# Patient Record
Sex: Female | Born: 1975 | Race: Asian | Hispanic: No | Marital: Married | Smoking: Never smoker
Health system: Southern US, Community
[De-identification: ages and names within clinical notes are randomized; demographics above are authoritative.]

## PROBLEM LIST (undated history)

## (undated) DIAGNOSIS — E785 Hyperlipidemia, unspecified: Secondary | ICD-10-CM

## (undated) DIAGNOSIS — D563 Thalassemia minor: Secondary | ICD-10-CM

## (undated) DIAGNOSIS — N200 Calculus of kidney: Secondary | ICD-10-CM

## (undated) DIAGNOSIS — R000 Tachycardia, unspecified: Secondary | ICD-10-CM

## (undated) DIAGNOSIS — M722 Plantar fascial fibromatosis: Secondary | ICD-10-CM

## (undated) DIAGNOSIS — K219 Gastro-esophageal reflux disease without esophagitis: Secondary | ICD-10-CM

## (undated) DIAGNOSIS — M2141 Flat foot [pes planus] (acquired), right foot: Secondary | ICD-10-CM

## (undated) DIAGNOSIS — N6019 Diffuse cystic mastopathy of unspecified breast: Secondary | ICD-10-CM

## (undated) DIAGNOSIS — J45909 Unspecified asthma, uncomplicated: Secondary | ICD-10-CM

## (undated) DIAGNOSIS — M2142 Flat foot [pes planus] (acquired), left foot: Secondary | ICD-10-CM

## (undated) DIAGNOSIS — I10 Essential (primary) hypertension: Secondary | ICD-10-CM

## (undated) DIAGNOSIS — T7840XA Allergy, unspecified, initial encounter: Secondary | ICD-10-CM

## (undated) HISTORY — DX: Flat foot (pes planus) (acquired), right foot: M21.41

## (undated) HISTORY — PX: TUBAL LIGATION: SHX77

## (undated) HISTORY — DX: Allergy, unspecified, initial encounter: T78.40XA

## (undated) HISTORY — DX: Gastro-esophageal reflux disease without esophagitis: K21.9

## (undated) HISTORY — DX: Calculus of kidney: N20.0

## (undated) HISTORY — DX: Unspecified asthma, uncomplicated: J45.909

## (undated) HISTORY — DX: Flat foot (pes planus) (acquired), left foot: M21.42

## (undated) HISTORY — DX: Thalassemia minor: D56.3

## (undated) HISTORY — DX: Plantar fascial fibromatosis: M72.2

## (undated) HISTORY — DX: Diffuse cystic mastopathy of unspecified breast: N60.19

## (undated) HISTORY — DX: Hyperlipidemia, unspecified: E78.5

---

## 2001-11-17 ENCOUNTER — Encounter: Payer: Self-pay | Admitting: Emergency Medicine

## 2001-11-17 ENCOUNTER — Emergency Department (HOSPITAL_COMMUNITY): Admission: EM | Admit: 2001-11-17 | Discharge: 2001-11-17 | Payer: Self-pay | Admitting: Emergency Medicine

## 2001-11-17 ENCOUNTER — Encounter: Payer: Self-pay | Admitting: *Deleted

## 2005-05-29 ENCOUNTER — Emergency Department: Payer: Self-pay | Admitting: Emergency Medicine

## 2010-09-30 ENCOUNTER — Other Ambulatory Visit: Payer: Self-pay | Admitting: Family Medicine

## 2010-09-30 ENCOUNTER — Ambulatory Visit (INDEPENDENT_AMBULATORY_CARE_PROVIDER_SITE_OTHER): Payer: 59 | Admitting: Family Medicine

## 2010-09-30 ENCOUNTER — Encounter: Payer: Self-pay | Admitting: Family Medicine

## 2010-09-30 DIAGNOSIS — N63 Unspecified lump in unspecified breast: Secondary | ICD-10-CM | POA: Insufficient documentation

## 2010-09-30 DIAGNOSIS — Z0000 Encounter for general adult medical examination without abnormal findings: Secondary | ICD-10-CM

## 2010-09-30 DIAGNOSIS — E785 Hyperlipidemia, unspecified: Secondary | ICD-10-CM

## 2010-09-30 LAB — HEPATIC FUNCTION PANEL
ALT: 13 U/L (ref 0–35)
AST: 17 U/L (ref 0–37)
Albumin: 4.1 g/dL (ref 3.5–5.2)
Alkaline Phosphatase: 66 U/L (ref 39–117)
Bilirubin, Direct: 0.1 mg/dL (ref 0.0–0.3)
Total Bilirubin: 0.6 mg/dL (ref 0.3–1.2)
Total Protein: 7 g/dL (ref 6.0–8.3)

## 2010-09-30 LAB — CBC WITH DIFFERENTIAL/PLATELET
Basophils Absolute: 0 10*3/uL (ref 0.0–0.1)
Basophils Relative: 0.3 % (ref 0.0–3.0)
Eosinophils Absolute: 0 10*3/uL (ref 0.0–0.7)
Eosinophils Relative: 0.6 % (ref 0.0–5.0)
HCT: 40.7 % (ref 36.0–46.0)
Hemoglobin: 13.2 g/dL (ref 12.0–15.0)
Lymphocytes Relative: 29.1 % (ref 12.0–46.0)
Lymphs Abs: 2.3 10*3/uL (ref 0.7–4.0)
MCHC: 32.5 g/dL (ref 30.0–36.0)
MCV: 71.8 fl — ABNORMAL LOW (ref 78.0–100.0)
Monocytes Absolute: 0.5 10*3/uL (ref 0.1–1.0)
Monocytes Relative: 6.9 % (ref 3.0–12.0)
Neutro Abs: 4.9 10*3/uL (ref 1.4–7.7)
Neutrophils Relative %: 63.1 % (ref 43.0–77.0)
Platelets: 310 10*3/uL (ref 150.0–400.0)
RBC: 5.66 Mil/uL — ABNORMAL HIGH (ref 3.87–5.11)
RDW: 15.3 % — ABNORMAL HIGH (ref 11.5–14.6)
WBC: 7.8 10*3/uL (ref 4.5–10.5)

## 2010-09-30 LAB — BASIC METABOLIC PANEL
BUN: 13 mg/dL (ref 6–23)
CO2: 30 mEq/L (ref 19–32)
Calcium: 9.3 mg/dL (ref 8.4–10.5)
Chloride: 103 mEq/L (ref 96–112)
Creatinine, Ser: 0.7 mg/dL (ref 0.4–1.2)
GFR: 110.26 mL/min (ref >60.00)
Glucose, Bld: 85 mg/dL (ref 70–99)
Potassium: 4.6 mEq/L (ref 3.5–5.1)
Sodium: 138 mEq/L (ref 135–145)

## 2010-09-30 LAB — LIPID PANEL
Cholesterol: 222 mg/dL — ABNORMAL HIGH (ref 0–200)
HDL: 58.2 mg/dL (ref >39.00)
Total CHOL/HDL Ratio: 4
Triglycerides: 120 mg/dL (ref 0.0–149.0)
VLDL: 24 mg/dL (ref 0.0–40.0)

## 2010-09-30 LAB — TSH: TSH: 1.02 u[IU]/mL (ref 0.35–5.50)

## 2010-09-30 LAB — LDL CHOLESTEROL, DIRECT: Direct LDL: 133 mg/dL

## 2010-10-04 ENCOUNTER — Ambulatory Visit: Payer: Self-pay | Admitting: Family Medicine

## 2010-10-04 NOTE — Assessment & Plan Note (Signed)
Summary: New Pt to est/[employer]/[facility] mailed NPP   Vital Signs:  Patient profile:   35 year old female Height:      61.5 inches Weight:      154.25 pounds BMI:     28.78 Temp:     98.9 degrees F oral Pulse rate:   76 / minute Pulse rhythm:   regular BP sitting:   112 / 80  (left arm) Cuff size:   regular  Vitals Entered By: Selena Batten Dance CMA Duncan Dull) (September 30, 2010 10:24 AM) CC: New patient to establish care   History of Present Illness: CC: new patient, establish, breast mass  1 1/2 wk h/o noted lump R breast.  upper mid breast.  No h/o lumps/bumps in past.  Not really tender.  LMP 09/20/2010.  usually last 3 days.  usually pretty regular.  no fmhx brca.  nonsmoker.  not on birth control.  preventative: tetanus - 2010, thinks Tdap. well woman exam - has been 2 years since last one.  Usually gets at [facility].  normal pap/breast in past.  -  Date:  07/17/2008    TD booster Tdap per pt  Current Medications (verified): 1)  Multivitamins  Tabs (Multiple Vitamin) .Marland Kitchen.. 1 By Mouth Once Daily  Allergies (verified): 1)  ! Sulfa  Past History:  Past Medical History: GERD mild HLD, never on meds h/o reflux induced asthma  Past Surgical History: BTL 2001 wisdom teeth removed  ~2003  Family History: Father: (dec 2010) lung CA, smoker, T2DM Mother: HTN, HLD Uncle: prostate CA Aunt: several  PGGM: rheumatoid arthritis  No other CA, no breast CA.  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  The patient denies anorexia, fever, weight loss, weight gain, vision loss, decreased hearing, hoarseness, chest pain, syncope, dyspnea on exertion, peripheral edema, prolonged cough, hemoptysis, abdominal pain, melena, hematochezia, severe  indigestion/heartburn, hematuria, and depression.    Physical Exam  General:  Well-developed,well-nourished,in no acute distress; alert,appropriate and cooperative throughout examination Head:  Normocephalic and atraumatic without obvious abnormalities. No apparent alopecia or balding. Eyes:  No corneal or conjunctival inflammation noted. EOMI. Perrla.  Ears:  TMs clear bilaterally Nose:  nares clear bilaterally Mouth:  MMM, no pharyngeal erythema/exudates, good dentition Neck:  No deformities, masses, or tenderness noted. Breasts:  no axillary LAD. R breast with small nodule upper inner quadrant, nontender.  + tender outer quadrant around 9 oclock but no mass appreciated. L breast no masses. no deformity, no skin changes, no nipple discharge, no retractions  Lungs:  Normal respiratory effort, chest expands symmetrically. Lungs are clear to auscultation, no crackles or wheezes. Heart:  Normal rate and regular rhythm. S1 and S2 normal without gallop, murmur, click, rub or other extra sounds. Abdomen:  Bowel sounds positive,abdomen soft and non-tender without masses, organomegaly or hernias noted. Msk:  No deformity or scoliosis noted of thoracic or lumbar spine.   Pulses:  2+ rad pulses, brisk cap refill Extremities:  no pedal edema Neurologic:  Cn grossly intact, station and gait intact Skin:  Intact without suspicious lesions or rashes Psych:  full affect, pleasant and cooperative   Impression & Recommendations:  Problem # 1:  BREAST MASS, RIGHT (ICD-611.72) feels benign, ? fibroadenoma vs scar tissue.  doubt cyst.  check dx mammo.  Orders: Radiology Referral (Radiology)  Problem # 2:  HEALTH MAINTENANCE EXAM (ICD-V70.0) baseline labs today.  preventative discussed, states utd (pap/breast at prior OBGYN)  Orders: Radiology Referral (Radiology)  Complete Medication List: 1)  Multivitamins Tabs (Multiple vitamin) .Marland Kitchen.. 1 by mouth once daily  Other Orders: Venipuncture  (84696) TLB-Lipid Panel (80061-LIPID) TLB-BMP (Basic Metabolic Panel-BMET) (80048-METABOL) TLB-CBC Platelet - w/Differential (85025-CBCD) TLB-Hepatic/Liver Function Pnl (80076-HEPATIC) TLB-TSH (Thyroid Stimulating Hormone) (29528-UXL)  Patient Instructions: 1)  Pass by Marion's office for referral for mammogram. 2)  check on Tdap (tetanus and pertussis shot) 3)  Good to meet you today, call clinic with questions. 4)  blood work today. 5)  Return as needed or in 1-2 years for next physical.   Orders Added: 1)  Venipuncture [36415] 2)  TLB-Lipid Panel [80061-LIPID] 3)  TLB-BMP (Basic Metabolic Panel-BMET) [80048-METABOL] 4)  TLB-CBC Platelet - w/Differential [85025-CBCD] 5)  TLB-Hepatic/Liver Function Pnl [80076-HEPATIC] 6)  TLB-TSH (Thyroid Stimulating Hormone) [84443-TSH] 7)  Radiology Referral [Radiology] 8)  New Patient 18-39 years [99385]     Current Allergies (reviewed today): ! SULFA

## 2010-10-07 ENCOUNTER — Telehealth: Payer: Self-pay | Admitting: Family Medicine

## 2010-10-07 NOTE — Telephone Encounter (Signed)
Discussed results with pt.  Normal mammo/breast US.  rec montior for now, update me if enlarging or changing.  O/w will keep eye on at next CPE.

## 2010-10-12 ENCOUNTER — Encounter: Payer: Self-pay | Admitting: Family Medicine

## 2010-11-20 LAB — HM MAMMOGRAPHY: HM Mammogram: NORMAL

## 2012-01-15 IMAGING — MG MAM DGTL DIAGNOSTIC MAMMO W/CAD
1 series · 8 of 8 positions shown · non-contrast
Comparison: none

REASON FOR EXAM: R MASS 12 OCLOCK  baseline
COMMENTS:

[R CC · right · 8 of 10 slices shown]
[im 1/10]
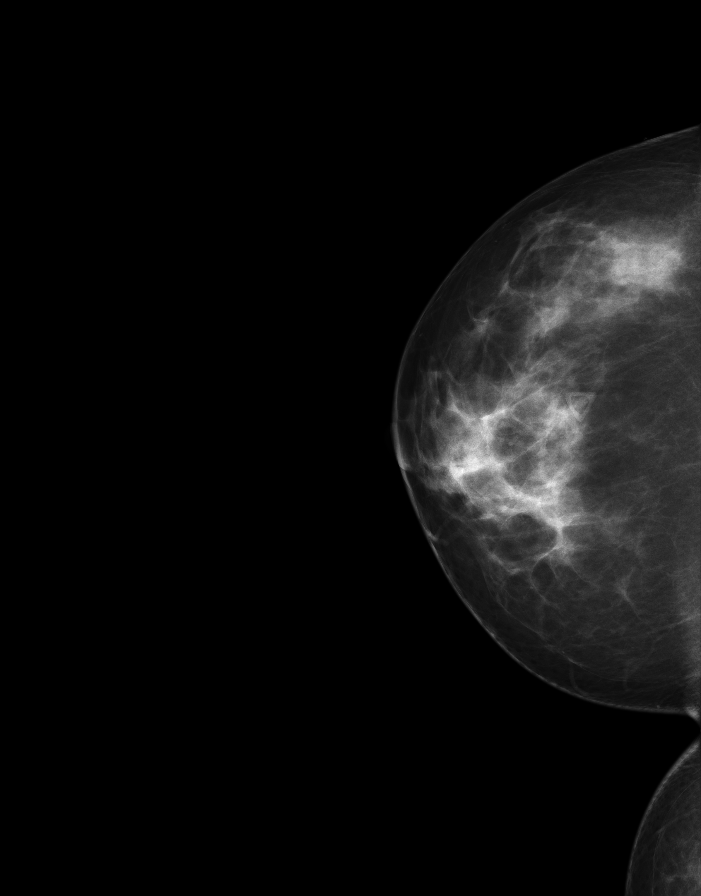
[im 2/10]
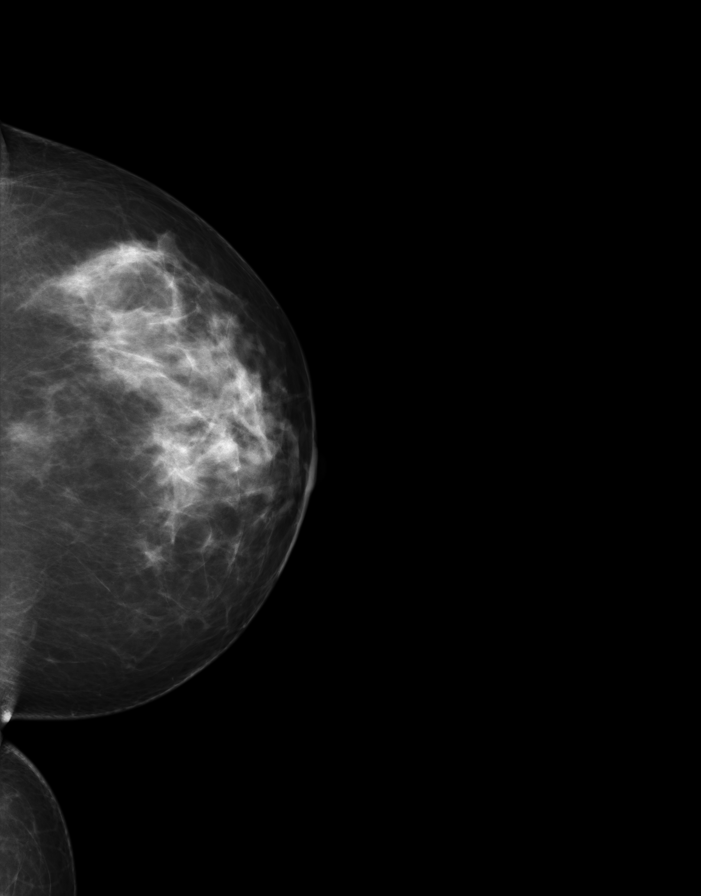
[im 3/10]
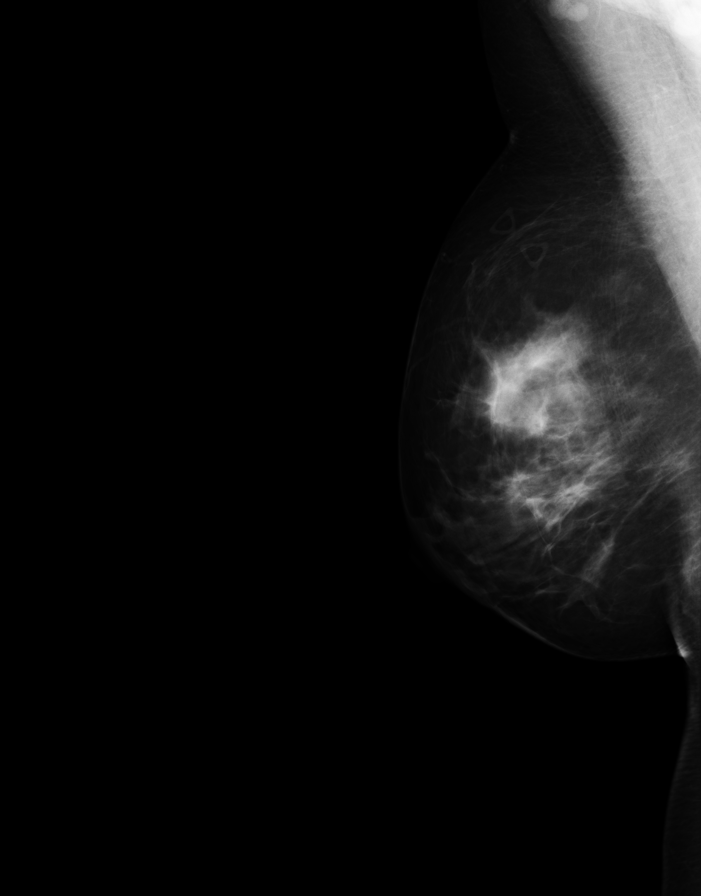
[im 4/10]
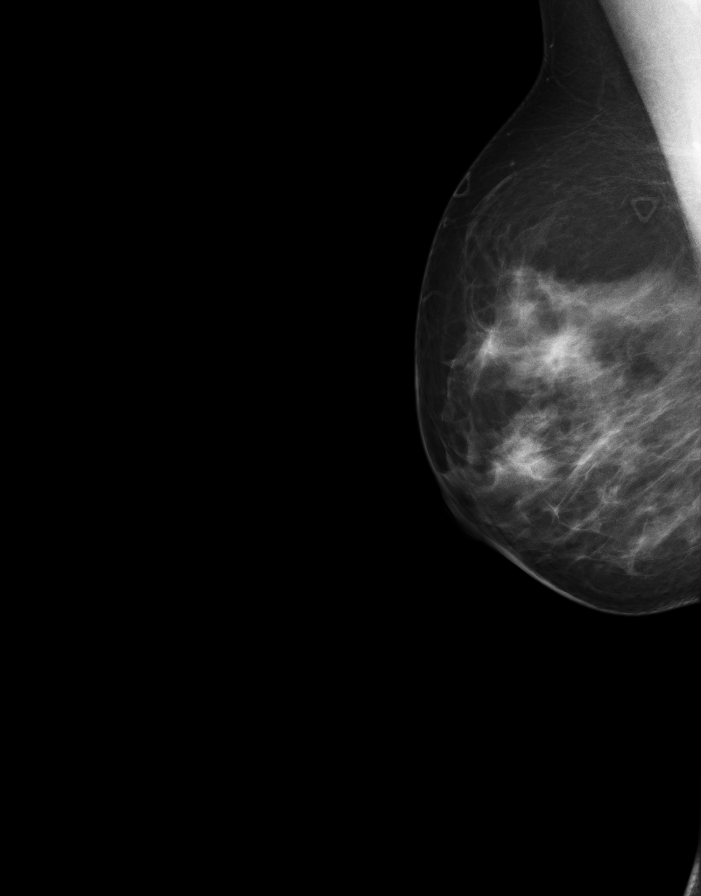
[im 6/10]
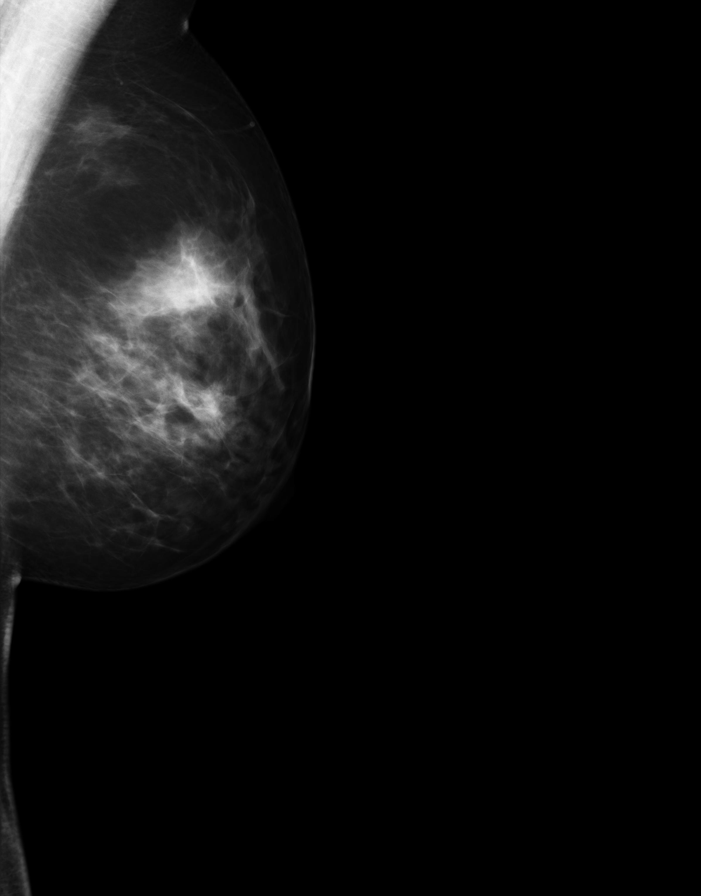
[im 7/10]
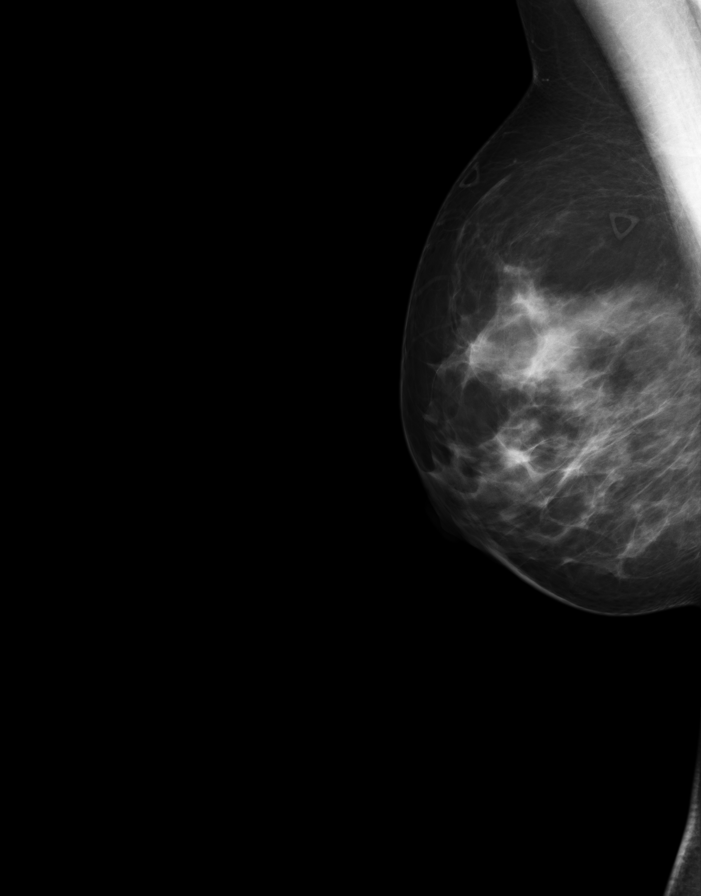
[im 8/10]
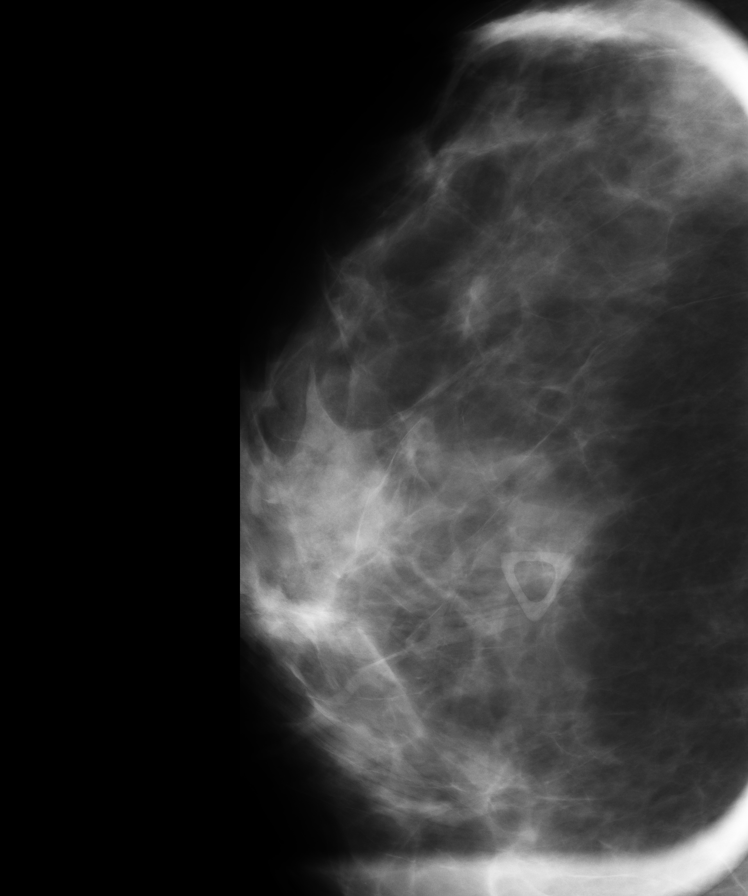
[im 10/10]
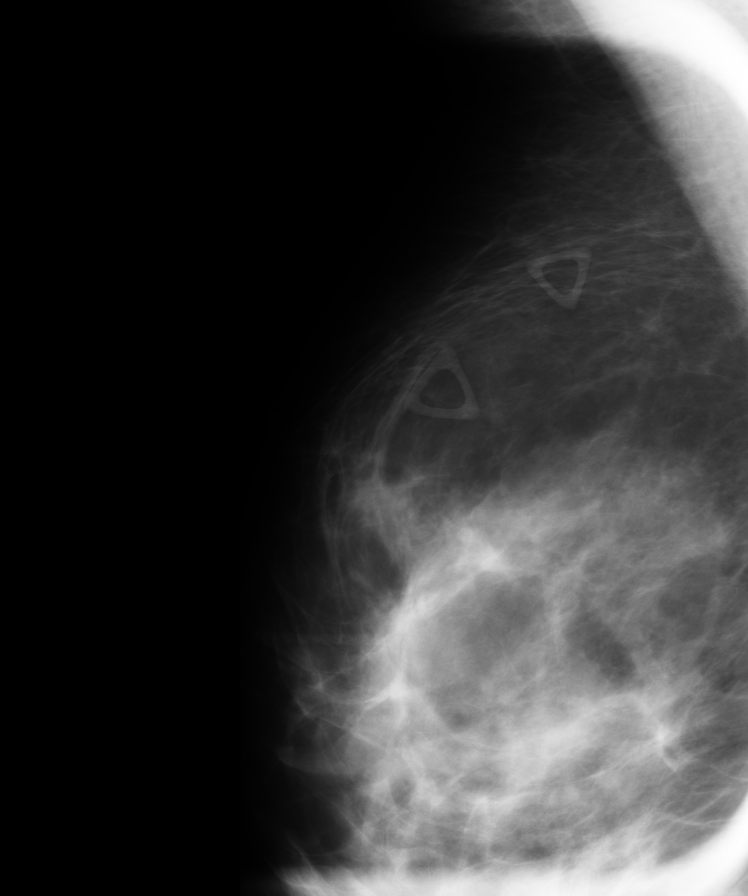

[8 of 8 positions shown; findings below may reference images not displayed]

PROCEDURE:     MAM - MAM DGTL DIAGNOSTIC MAMMO W/CAD  - October 04, 2010  [DATE]

RESULT:     The breasts demonstrate a heterogeneous parenchymal pattern with
moderate diffuse increased density. The region of palpable concern within
the right breast was further evaluated with magnification compression
imaging.  There is no evidence of malignant type calcifications,
architectural distortion, masses or nodules. The breast parenchyma does
demonstrate a heterogeneous parenchymal pattern and the region of concern in
the vicinity of areas of diffuse asymmetric density likely represent a
region of asymmetric normal breast parenchyma. There is no radiographic
evidence to suggest malignancy. If there is persistent clinical concern,
surgical consultation is recommended. Sonographic evaluation of the region
of interest demonstrates no sonographic abnormalities.
IMPRESSION: 1.As stated above, if there is persistent clinical concern, further
evaluation with surgical consultation is recommended.

BI-RADS: Category 2 - Benign Findings

A NEGATIVE MAMMOGRAM REPORT DOES NOT PRECLUDE BIOPSY OR OTHER EVALUATION OF
A CLINICALLY PALPABLE OR OTHERWISE SUSPICIOUS MASS OR LESION. BREAST CANCER
MAY NOT BE DETECTED BY MAMMOGRAPHY IN UP TO 10% OF CASES.

## 2012-11-18 ENCOUNTER — Ambulatory Visit (INDEPENDENT_AMBULATORY_CARE_PROVIDER_SITE_OTHER): Payer: 59 | Admitting: Internal Medicine

## 2012-11-18 ENCOUNTER — Encounter: Payer: Self-pay | Admitting: Internal Medicine

## 2012-11-18 VITALS — BP 124/78 | HR 93 | Temp 98.2°F | Resp 14 | Ht 62.0 in | Wt 167.2 lb

## 2012-11-18 DIAGNOSIS — M722 Plantar fascial fibromatosis: Secondary | ICD-10-CM

## 2012-11-18 DIAGNOSIS — R5381 Other malaise: Secondary | ICD-10-CM

## 2012-11-18 DIAGNOSIS — M2140 Flat foot [pes planus] (acquired), unspecified foot: Secondary | ICD-10-CM

## 2012-11-18 DIAGNOSIS — R5383 Other fatigue: Secondary | ICD-10-CM

## 2012-11-18 DIAGNOSIS — D563 Thalassemia minor: Secondary | ICD-10-CM

## 2012-11-18 DIAGNOSIS — Z124 Encounter for screening for malignant neoplasm of cervix: Secondary | ICD-10-CM

## 2012-11-18 DIAGNOSIS — E785 Hyperlipidemia, unspecified: Secondary | ICD-10-CM

## 2012-11-18 DIAGNOSIS — M2141 Flat foot [pes planus] (acquired), right foot: Secondary | ICD-10-CM

## 2012-11-18 NOTE — Patient Instructions (Addendum)
Return for fasting labs at your leisure  Or just before your physical  We will schedule a mammogram at [facility] at your physical..

## 2012-11-18 NOTE — Progress Notes (Signed)
Patient ID: Amy Lara, female   DOB: March 14, 1976, 37 y.o.   MRN: [id number]   Patient Active Problem List   Diagnosis Date Noted  . Other and unspecified hyperlipidemia 11/19/2012  . Fibrocystic breast disease in female   . Plantar fasciitis, bilateral   . Acquired pes planus of both feet   . Thalassemia carrier     Subjective:  CC:   Chief Complaint  Patient presents with  . Establish Care    HPI:   Amy Lara is a 37 y.o. female who presents as a new patient to establish primary care with the chief complaint of  Reflux induced asthma,  Quiescent for several years  Ago while supine .  Does not occur while exercising. Uses occasional H2 blockers.  Environmental exposure to both parent's cigarette smoke.    Nephrolithiasis,  Occurred years ago while stationed in [place] with husband in [place],  Believed to be secondary to an untreated cystitis leading to  Pyelonephritis leading to stones, per Urology eval,  None in years   High cholesterol found during [facility] labs.  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  No anemia.   Treated for flu at [facility]  Had symptoms On Easter Sunday.  eval on Thursday.  Treated only with cough suppressants .   No constipation issues but does not move them daily   Fibrous cyst on diagnostic mammogram/ultrasound  for self found lump left breast,  2012.     Plantar fasciitis and pes planus , managed with orthotics by Dr Al Corpus.,      Past Medical History  Diagnosis Date  . Asthma   . GERD (gastroesophageal reflux disease)   . Allergy   . Hyperlipidemia   . Kidney stones   . Fibrocystic breast disease in female     no prior biopsy  . Plantar fasciitis, bilateral   . Acquired pes planus of both feet   . Thalassemia carrier     Past Surgical History  Procedure Laterality Date  . Tubal ligation      Family History  Problem Relation Age of Onset  . Hyperlipidemia Mother   . Cancer Father   . Hyperlipidemia Father    . Hyperlipidemia Paternal Aunt   . Hyperlipidemia Paternal Uncle   . Cancer Paternal Uncle   . Heart disease Paternal Grandmother   . Hyperlipidemia Paternal Grandfather     History   Social History  . Marital Status: Married    Spouse Name: N/A    Number of Children: N/A  . Years of Education: N/A   Occupational History  . Not on file.   Social History Main Topics  . Smoking status: Never Smoker   . Smokeless tobacco: Never Used  . Alcohol Use: Yes     Comment: occasioally  . Drug Use: No  . Sexually Active: Not on file   Other Topics Concern  . Not on file   Social History Narrative  . No narrative on file       @ALLHX @    Review of Systems:   The remainder of the review of systems was negative except those addressed in the HPI.       Objective:  BP 124/78  Pulse 93  Temp(Src) 98.2 F (36.8 C) (Oral)  Resp 14  Ht 5\' 2"  (1.575 m)  Wt 167 lb 4 oz (75.864 kg)  BMI 30.58 kg/m2  SpO2 99%  LMP 10/26/2012  General appearance: alert, cooperative and appears  stated age Ears: normal TM's and external ear canals both ears Throat: lips, mucosa, and tongue normal; teeth and gums normal Neck: no adenopathy, no carotid bruit, supple, symmetrical, trachea midline and thyroid not enlarged, symmetric, no tenderness/mass/nodules Back: symmetric, no curvature. ROM normal. No CVA tenderness. Lungs: clear to auscultation bilaterally Heart: regular rate and rhythm, S1, S2 normal, no murmur, click, rub or gallop Abdomen: soft, non-tender; bowel sounds normal; no masses,  no organomegaly Pulses: 2+ and symmetric Skin: Skin color, texture, turgor normal. No rashes or lesions Lymph nodes: Cervical, supraclavicular, and axillary nodes normal.  Assessment and Plan:  Thalassemia carrier No history of anemia,  Husband is not a carrier.,  Daughter has not been tested  Plantar fasciitis, bilateral Managed by Dr Al Corpus  Acquired pes planus of both feet Managed by Dr  Al Corpus WITH ORTHOTICS  Other and unspecified hyperlipidemia Patient will return for fasting lipids prior to her physical.    Updated Medication List No outpatient encounter prescriptions on file as of 11/18/2012.   No facility-administered encounter medications on file as of 11/18/2012.     Orders Placed This Encounter  Procedures  . HM MAMMOGRAPHY  . CBC with Differential  . Comprehensive metabolic panel  . TSH  . Lipid panel  . HM PAP SMEAR    No Follow-up on file.

## 2012-11-19 ENCOUNTER — Encounter: Payer: Self-pay | Admitting: Internal Medicine

## 2012-11-19 DIAGNOSIS — M722 Plantar fascial fibromatosis: Secondary | ICD-10-CM | POA: Insufficient documentation

## 2012-11-19 DIAGNOSIS — N6019 Diffuse cystic mastopathy of unspecified breast: Secondary | ICD-10-CM | POA: Insufficient documentation

## 2012-11-19 DIAGNOSIS — D563 Thalassemia minor: Secondary | ICD-10-CM | POA: Insufficient documentation

## 2012-11-19 DIAGNOSIS — M2141 Flat foot [pes planus] (acquired), right foot: Secondary | ICD-10-CM | POA: Insufficient documentation

## 2012-11-19 DIAGNOSIS — E785 Hyperlipidemia, unspecified: Secondary | ICD-10-CM | POA: Insufficient documentation

## 2012-11-19 NOTE — Assessment & Plan Note (Signed)
Managed by Dr Al Corpus WITH ORTHOTICS

## 2012-11-19 NOTE — Assessment & Plan Note (Signed)
Managed by Dr Al Corpus

## 2012-11-19 NOTE — Assessment & Plan Note (Signed)
No history of anemia,  Husband is not a carrier.,  Daughter has not been tested

## 2012-11-19 NOTE — Assessment & Plan Note (Signed)
Patient will return for fasting lipids prior to her physical.

## 2012-12-30 ENCOUNTER — Other Ambulatory Visit (INDEPENDENT_AMBULATORY_CARE_PROVIDER_SITE_OTHER): Payer: 59

## 2012-12-30 DIAGNOSIS — D563 Thalassemia minor: Secondary | ICD-10-CM

## 2012-12-30 DIAGNOSIS — R5381 Other malaise: Secondary | ICD-10-CM

## 2012-12-30 DIAGNOSIS — R5383 Other fatigue: Secondary | ICD-10-CM

## 2012-12-30 DIAGNOSIS — E785 Hyperlipidemia, unspecified: Secondary | ICD-10-CM

## 2012-12-30 LAB — COMPREHENSIVE METABOLIC PANEL
ALT: 11 U/L (ref 0–35)
AST: 14 U/L (ref 0–37)
Alkaline Phosphatase: 67 U/L (ref 39–117)
Chloride: 103 mEq/L (ref 96–112)
Creatinine, Ser: 0.7 mg/dL (ref 0.4–1.2)
Total Bilirubin: 0.2 mg/dL — ABNORMAL LOW (ref 0.3–1.2)

## 2012-12-30 LAB — CBC WITH DIFFERENTIAL/PLATELET
Basophils Absolute: 0 10*3/uL (ref 0.0–0.1)
Eosinophils Absolute: 0.1 10*3/uL (ref 0.0–0.7)
Hemoglobin: 12 g/dL (ref 12.0–15.0)
Lymphocytes Relative: 29.4 % (ref 12.0–46.0)
Lymphs Abs: 2.4 10*3/uL (ref 0.7–4.0)
MCHC: 31.9 g/dL (ref 30.0–36.0)
MCV: 71.4 fl — ABNORMAL LOW (ref 78.0–100.0)
Monocytes Absolute: 0.6 10*3/uL (ref 0.1–1.0)
Neutro Abs: 5 10*3/uL (ref 1.4–7.7)
RDW: 15.3 % — ABNORMAL HIGH (ref 11.5–14.6)

## 2012-12-30 LAB — LIPID PANEL
Cholesterol: 195 mg/dL (ref 0–200)
HDL: 43.8 mg/dL (ref >39.00)
Total CHOL/HDL Ratio: 4
Triglycerides: 271 mg/dL — ABNORMAL HIGH (ref 0.0–149.0)
VLDL: 54.2 mg/dL — ABNORMAL HIGH (ref 0.0–40.0)

## 2013-01-07 ENCOUNTER — Ambulatory Visit (INDEPENDENT_AMBULATORY_CARE_PROVIDER_SITE_OTHER): Payer: 59 | Admitting: Internal Medicine

## 2013-01-07 ENCOUNTER — Other Ambulatory Visit (HOSPITAL_COMMUNITY)
Admission: RE | Admit: 2013-01-07 | Discharge: 2013-01-07 | Disposition: A | Discharge status: Home / Self Care | Payer: 59 | Source: Ambulatory Visit | Attending: Internal Medicine | Admitting: Internal Medicine

## 2013-01-07 ENCOUNTER — Encounter: Payer: Self-pay | Admitting: Internal Medicine

## 2013-01-07 VITALS — BP 118/78 | HR 82 | Temp 98.2°F | Resp 16 | Ht 62.0 in | Wt 165.0 lb

## 2013-01-07 DIAGNOSIS — E785 Hyperlipidemia, unspecified: Secondary | ICD-10-CM

## 2013-01-07 DIAGNOSIS — Z0000 Encounter for general adult medical examination without abnormal findings: Secondary | ICD-10-CM

## 2013-01-07 DIAGNOSIS — Z1151 Encounter for screening for human papillomavirus (HPV): Secondary | ICD-10-CM | POA: Insufficient documentation

## 2013-01-07 DIAGNOSIS — Z124 Encounter for screening for malignant neoplasm of cervix: Secondary | ICD-10-CM

## 2013-01-07 DIAGNOSIS — R5381 Other malaise: Secondary | ICD-10-CM

## 2013-01-07 DIAGNOSIS — Z1159 Encounter for screening for other viral diseases: Secondary | ICD-10-CM

## 2013-01-07 DIAGNOSIS — Z1239 Encounter for other screening for malignant neoplasm of breast: Secondary | ICD-10-CM

## 2013-01-07 DIAGNOSIS — R5383 Other fatigue: Secondary | ICD-10-CM

## 2013-01-07 DIAGNOSIS — Z01419 Encounter for gynecological examination (general) (routine) without abnormal findings: Secondary | ICD-10-CM | POA: Insufficient documentation

## 2013-01-07 MED ORDER — CEPHALEXIN 500 MG PO TABS: 500.0000 mg | ORAL_TABLET | Freq: Three times a day (TID) | ORAL | Status: DC

## 2013-01-07 NOTE — Progress Notes (Signed)
Patient ID: Amy Lara, female   DOB: January 02, 1976, 37 y.o.   MRN: [id number]  Subjective:     Amy Lara is a 37 y.o. female and is here for a comprehensive physical exam. The patient reports no problems.  History   Social History  . Marital Status: Married    Spouse Name: N/A    Number of Children: N/A  . Years of Education: N/A   Occupational History  . Not on file.   Social History Main Topics  . Smoking status: Never Smoker   . Smokeless tobacco: Never Used  . Alcohol Use: Yes     Comment: occasioally  . Drug Use: No  . Sexually Active: Not on file   Other Topics Concern  . Not on file   Social History Narrative  . No narrative on file   Health Maintenance  Topic Date Due  . Influenza Vaccine  03/17/2013  . Pap Smear  11/19/2015  . Tetanus/tdap  07/17/2018    The following portions of the patient's history were reviewed and updated as appropriate: allergies, current medications, past family history, past medical history, past social history, past surgical history and problem list.  Review of Systems A comprehensive review of systems was negative.   Objective:   BP 118/78  Pulse 82  Temp(Src) 98.2 F (36.8 C) (Oral)  Resp 16  Ht 5\' 2"  (1.575 m)  Wt 165 lb (74.844 kg)  BMI 30.17 kg/m2  SpO2 99%  LMP 12/20/2012   General Appearance:    Alert, cooperative, no distress, appears stated age  Head:    Normocephalic, without obvious abnormality, atraumatic  Eyes:    PERRL, conjunctiva/corneas clear, EOM's intact, fundi    benign, both eyes  Ears:    Normal TM's and external ear canals, both ears  Nose:   Nares normal, septum midline, mucosa normal, no drainage    or sinus tenderness  Throat:   Lips, mucosa, and tongue normal; teeth and gums normal  Neck:   Supple, symmetrical, trachea midline, no adenopathy;    thyroid:  no enlargement/tenderness/nodules; no carotid   bruit or JVD  Back:     Symmetric, no curvature, ROM normal, no CVA tenderness   Lungs:     Clear to auscultation bilaterally, respirations unlabored  Chest Wall:    No tenderness or deformity   Heart:    Regular rate and rhythm, S1 and S2 normal, no murmur, rub   or gallop  Breast Exam:    No tenderness, masses, or nipple abnormality  Abdomen:     Soft, non-tender, bowel sounds active all four quadrants,    no masses, no organomegaly  Genitalia:    Pelvic: cervix normal in appearance, external genitalia normal, no adnexal masses or tenderness, no cervical motion tenderness, rectovaginal septum normal, uterus normal size, shape, and consistency and vagina normal without discharge  Extremities:   Extremities normal, atraumatic, no cyanosis or edema  Pulses:   2+ and symmetric all extremities  Skin:   Skin color, texture, turgor normal, no rashes or lesions  Lymph nodes:   Cervical, supraclavicular, and axillary nodes normal  Neurologic:   CNII-XII intact, normal strength, sensation and reflexes    throughout    Assessment:      Routine general medical examination at a health care facility Annual exam including breast , pelvic and PAP smear were done today. Baseline screening mammogram ordered.   Other and unspecified hyperlipidemia Her triglycerides have become elevated.  Diet has rrecently included  daily quick oatmeal.  Protein breakfast recommended and repeat lipids 4 months    Updated Medication List Outpatient Encounter Prescriptions as of 01/07/2013  Medication Sig Dispense Refill  . Cephalexin 500 MG tablet Take 1 tablet (500 mg total) by mouth 3 (three) times daily.  21 tablet  1   No facility-administered encounter medications on file as of 01/07/2013.

## 2013-01-07 NOTE — Assessment & Plan Note (Signed)
Annual exam including breast , pelvic and PAP smear were done today. Baseline screening mammogram ordered.

## 2013-01-07 NOTE — Assessment & Plan Note (Signed)
Her triglycerides have become elevated.  Diet has rrecently included daily quick oatmeal.  Protein breakfast recommended and repeat lipids 4 months

## 2013-01-07 NOTE — Patient Instructions (Addendum)
We will e mail you your PAP results  We can repeat your cholesterol in 3 or 6 months (give up the oatmeal for breakfast)

## 2013-01-10 ENCOUNTER — Encounter: Payer: Self-pay | Admitting: Internal Medicine

## 2013-05-22 ENCOUNTER — Other Ambulatory Visit: Payer: Self-pay

## 2015-03-29 ENCOUNTER — Ambulatory Visit (INDEPENDENT_AMBULATORY_CARE_PROVIDER_SITE_OTHER): Payer: 59 | Admitting: Family Medicine

## 2015-03-29 ENCOUNTER — Encounter: Payer: Self-pay | Admitting: Family Medicine

## 2015-03-29 VITALS — BP 124/80 | HR 98 | Temp 98.7°F | Ht 62.0 in | Wt 172.0 lb

## 2015-03-29 DIAGNOSIS — H578 Other specified disorders of eye and adnexa: Secondary | ICD-10-CM | POA: Diagnosis not present

## 2015-03-29 DIAGNOSIS — H5789 Other specified disorders of eye and adnexa: Secondary | ICD-10-CM

## 2015-03-29 MED ORDER — CLINDAMYCIN HCL 300 MG PO CAPS: 300.0000 mg | ORAL_CAPSULE | Freq: Three times a day (TID) | ORAL | Status: DC

## 2015-03-29 MED ORDER — DESONIDE 0.05 % EX OINT
1.0000 | TOPICAL_OINTMENT | Freq: Two times a day (BID) | CUTANEOUS | Status: DC
Start: 2015-03-29 — End: 2016-01-17

## 2015-03-29 NOTE — Progress Notes (Signed)
   Subjective:  Patient ID: Amy Lara, female    DOB: 1976-06-24  Age: 39 y.o. MRN: [id number]  CC: Eye swelling  HPI:  39 year old female presents to clinic today for an acute visit with complaints of left eye swelling.  Patient reports that on Friday she developed swelling on the lateral aspect of her left eye. She reports some associated pain and tenderness. She feels like there may also be some lymphadenopathy in the surrounding region. She has been taking allergy medication with no relief. No known exacerbating factors. No known inciting factors. No new exposures, sick contacts. No associated fever, chills. She states she otherwise feels well.  Social Hx   Social History   Social History  . Marital Status: Married    Spouse Name: N/A  . Number of Children: N/A  . Years of Education: N/A   Social History Main Topics  . Smoking status: Never Smoker   . Smokeless tobacco: Never Used  . Alcohol Use: Yes     Comment: occasioally  . Drug Use: No  . Sexual Activity: Not Asked   Other Topics Concern  . None   Social History Narrative   Review of Systems  Constitutional: Negative for fever and chills.  HENT:       Swelling around eye (left).   Objective:  BP 124/80 mmHg  Pulse 98  Temp(Src) 98.7 F (37.1 C) (Oral)  Ht  (1.575 m)  Wt 172 lb (78.019 kg)  BMI 31.45 kg/m2  SpO2 98%  LMP 02/26/2015  BP/Weight 03/29/2015 01/07/2013 11/18/2012  Systolic BP 124 118 124  Diastolic BP 80 78 78  Wt. (Lbs) 172 165 167.25  BMI 31.45 30.17 30.58    Physical Exam  Constitutional: She appears well-developed and well-nourished. No distress.  HENT:  Head: Normocephalic and atraumatic.  Eyes: Conjunctivae and EOM are normal. Pupils are equal, round, and reactive to light. Left eye exhibits no discharge.  Cardiovascular: Normal rate and regular rhythm.   No murmur heard. Pulmonary/Chest: Breath sounds normal. No respiratory distress. She has no wheezes. She has no rales.    Skin:  Left periorbital region - mild erythema and swelling laterally. Skin appears slightly dry as well. Palpable warmth.   Assessment & Plan:   Problem List Items Addressed This Visit    Periorbital swelling - Primary    New problem. Given palpable warmth and mild erythema there is concern about periorbital cellulitis. Could also be contact in origin. Treating with topical desonide.  Also covering empirically for periorbital cellulitis with clindamycin. Patient to call in the next few days to let me know how she's doing.         Meds ordered this encounter  Medications  . clindamycin (CLEOCIN) 300 MG capsule    Sig: Take 1 capsule (300 mg total) by mouth 3 (three) times daily.    Dispense:  30 capsule    Refill:  0  . desonide (DESOWEN) 0.05 % ointment    Sig: Apply 1 application topically 2 (two) times daily.    Dispense:  15 g    Refill:  0    Follow-up: PRN  Everlene Other, DO

## 2015-03-29 NOTE — Progress Notes (Signed)
Pre visit review using our clinic review tool, if applicable. No additional management support is needed unless otherwise documented below in the visit note. 

## 2015-03-29 NOTE — Patient Instructions (Signed)
It was nice to see you today.  Take the antibiotic as prescribed.  Use the topical agent twice daily for ~ 1 week.  Call to let me know how you're doing in a few days.  Take care  Dr. Adriana Simas

## 2015-03-29 NOTE — Assessment & Plan Note (Signed)
New problem. Given palpable warmth and mild erythema there is concern about periorbital cellulitis. Could also be contact in origin. Treating with topical desonide.  Also covering empirically for periorbital cellulitis with clindamycin. Patient to call in the next few days to let me know how she's doing.

## 2015-04-02 ENCOUNTER — Telehealth: Payer: Self-pay

## 2015-04-02 NOTE — Telephone Encounter (Signed)
Pt states she is doing much bette still has some swelling, but said nothing to major. She will finish the medication given at visit and if not better will make another appointment.

## 2015-10-04 ENCOUNTER — Telehealth: Payer: Self-pay | Admitting: Internal Medicine

## 2015-10-04 DIAGNOSIS — Z124 Encounter for screening for malignant neoplasm of cervix: Secondary | ICD-10-CM

## 2015-10-04 DIAGNOSIS — E559 Vitamin D deficiency, unspecified: Secondary | ICD-10-CM

## 2015-10-04 DIAGNOSIS — E785 Hyperlipidemia, unspecified: Secondary | ICD-10-CM

## 2015-10-04 DIAGNOSIS — R5383 Other fatigue: Secondary | ICD-10-CM

## 2015-10-04 NOTE — Telephone Encounter (Signed)
Pt called to schedule for her physical appt. Pt needs orders please and thank you! Call pt @ [phone].

## 2015-10-04 NOTE — Telephone Encounter (Signed)
Thanks, ordered.

## 2015-10-04 NOTE — Telephone Encounter (Signed)
Pt is requesting labs for physical

## 2015-10-04 NOTE — Telephone Encounter (Signed)
Labs scheduled  

## 2015-12-22 ENCOUNTER — Other Ambulatory Visit: Payer: 59

## 2015-12-28 ENCOUNTER — Encounter: Payer: 59 | Admitting: Internal Medicine

## 2015-12-31 ENCOUNTER — Encounter: Payer: 59 | Admitting: Internal Medicine

## 2016-01-14 ENCOUNTER — Other Ambulatory Visit (INDEPENDENT_AMBULATORY_CARE_PROVIDER_SITE_OTHER): Payer: 59

## 2016-01-14 DIAGNOSIS — E559 Vitamin D deficiency, unspecified: Secondary | ICD-10-CM | POA: Diagnosis not present

## 2016-01-14 DIAGNOSIS — R5383 Other fatigue: Secondary | ICD-10-CM

## 2016-01-14 DIAGNOSIS — E785 Hyperlipidemia, unspecified: Secondary | ICD-10-CM | POA: Diagnosis not present

## 2016-01-14 LAB — LIPID PANEL
Cholesterol: 237 mg/dL — ABNORMAL HIGH (ref 0–200)
HDL: 61.4 mg/dL (ref >39.00)
LDL CALC: 146 mg/dL — AB (ref 0–99)
NonHDL: 175.36
TRIGLYCERIDES: 147 mg/dL (ref 0.0–149.0)
Total CHOL/HDL Ratio: 4
VLDL: 29.4 mg/dL (ref 0.0–40.0)

## 2016-01-14 LAB — COMPREHENSIVE METABOLIC PANEL
ALBUMIN: 4.2 g/dL (ref 3.5–5.2)
ALK PHOS: 68 U/L (ref 39–117)
ALT: 11 U/L (ref 0–35)
AST: 12 U/L (ref 0–37)
BUN: 11 mg/dL (ref 6–23)
CO2: 29 mEq/L (ref 19–32)
Calcium: 9.7 mg/dL (ref 8.4–10.5)
Chloride: 101 mEq/L (ref 96–112)
Creatinine, Ser: 0.72 mg/dL (ref 0.40–1.20)
GFR: 95.23 mL/min (ref >60.00)
Glucose, Bld: 95 mg/dL (ref 70–99)
POTASSIUM: 4 meq/L (ref 3.5–5.1)
Sodium: 137 mEq/L (ref 135–145)
TOTAL PROTEIN: 7.9 g/dL (ref 6.0–8.3)
Total Bilirubin: 0.4 mg/dL (ref 0.2–1.2)

## 2016-01-14 LAB — CBC WITH DIFFERENTIAL/PLATELET
Basophils Absolute: 0 10*3/uL (ref 0.0–0.1)
Basophils Relative: 0.4 % (ref 0.0–3.0)
EOS PCT: 0.5 % (ref 0.0–5.0)
Eosinophils Absolute: 0 10*3/uL (ref 0.0–0.7)
HCT: 40.9 % (ref 36.0–46.0)
HEMOGLOBIN: 12.9 g/dL (ref 12.0–15.0)
LYMPHS ABS: 2.7 10*3/uL (ref 0.7–4.0)
Lymphocytes Relative: 30.4 % (ref 12.0–46.0)
MCHC: 31.6 g/dL (ref 30.0–36.0)
MCV: 70.4 fl — AB (ref 78.0–100.0)
MONOS PCT: 5.6 % (ref 3.0–12.0)
Monocytes Absolute: 0.5 10*3/uL (ref 0.1–1.0)
Neutro Abs: 5.6 10*3/uL (ref 1.4–7.7)
Neutrophils Relative %: 63.1 % (ref 43.0–77.0)
Platelets: 357 10*3/uL (ref 150.0–400.0)
RBC: 5.81 Mil/uL — AB (ref 3.87–5.11)
RDW: 15.2 % (ref 11.5–15.5)
WBC: 8.8 10*3/uL (ref 4.0–10.5)

## 2016-01-14 LAB — TSH: TSH: 1.66 u[IU]/mL (ref 0.35–4.50)

## 2016-01-14 LAB — VITAMIN D 25 HYDROXY (VIT D DEFICIENCY, FRACTURES): VITD: 29.72 ng/mL — AB (ref 30.00–100.00)

## 2016-01-16 ENCOUNTER — Encounter: Payer: Self-pay | Admitting: Internal Medicine

## 2016-01-17 ENCOUNTER — Ambulatory Visit (INDEPENDENT_AMBULATORY_CARE_PROVIDER_SITE_OTHER): Payer: 59 | Admitting: Internal Medicine

## 2016-01-17 ENCOUNTER — Other Ambulatory Visit (HOSPITAL_COMMUNITY)
Admission: RE | Admit: 2016-01-17 | Discharge: 2016-01-17 | Disposition: A | Discharge status: Home / Self Care | Payer: 59 | Source: Ambulatory Visit | Attending: Internal Medicine | Admitting: Internal Medicine

## 2016-01-17 ENCOUNTER — Encounter: Payer: Self-pay | Admitting: Internal Medicine

## 2016-01-17 VITALS — BP 120/82 | HR 88 | Temp 98.5°F | Resp 12 | Ht 61.5 in | Wt 173.0 lb

## 2016-01-17 DIAGNOSIS — Z0000 Encounter for general adult medical examination without abnormal findings: Secondary | ICD-10-CM

## 2016-01-17 DIAGNOSIS — E785 Hyperlipidemia, unspecified: Secondary | ICD-10-CM | POA: Diagnosis not present

## 2016-01-17 DIAGNOSIS — Z124 Encounter for screening for malignant neoplasm of cervix: Secondary | ICD-10-CM | POA: Diagnosis not present

## 2016-01-17 DIAGNOSIS — Z1239 Encounter for other screening for malignant neoplasm of breast: Secondary | ICD-10-CM | POA: Diagnosis not present

## 2016-01-17 DIAGNOSIS — Z01419 Encounter for gynecological examination (general) (routine) without abnormal findings: Secondary | ICD-10-CM | POA: Diagnosis present

## 2016-01-17 DIAGNOSIS — Z1151 Encounter for screening for human papillomavirus (HPV): Secondary | ICD-10-CM | POA: Insufficient documentation

## 2016-01-17 DIAGNOSIS — J45990 Exercise induced bronchospasm: Secondary | ICD-10-CM

## 2016-01-17 DIAGNOSIS — D563 Thalassemia minor: Secondary | ICD-10-CM

## 2016-01-17 MED ORDER — OMEPRAZOLE 40 MG PO CPDR
40.0000 mg | DELAYED_RELEASE_CAPSULE | Freq: Every day | ORAL | Status: DC
Start: 2016-01-17 — End: 2018-05-13

## 2016-01-17 NOTE — Progress Notes (Signed)
Patient ID: Amy Lara, female    DOB: 01/18/1976  Age: 40 y.o. MRN: [id number]  The patient is here for complete physical examination.  She has not been seen in 3 years ,  Periods have occurring regularly and last less than days.  No baseline mammogram  . Had a diagnostic mammogram  By Sharen Hones back in  2011 for right breast lump. Normal. 3 d mammo advised.  Father in law passed away Amy Lara) in April .    The risk factors are reflected in the social history.  The roster of all physicians providing medical care to patient - is listed in the Snapshot section of the chart.  Home safety : The patient has smoke detectors in the home. They wear seatbelts.  There are no firearms at home. There is no violence in the home.   There is no risks for hepatitis, STDs or HIV. There is no   history of blood transfusion. They have no travel history to infectious disease endemic areas of the world.  The patient has seen their dentist in the last six month. They have seen their eye doctor in the last year.   They do not  have excessive sun exposure. Discussed the need for sun protection: hats, long sleeves and use of sunscreen if there is significant sun exposure.   Diet: the importance of a healthy diet is discussed. They do have a healthy diet.  The benefits of regular aerobic exercise were discussed. She exercises 4 days per week 30 minutes.   Depression screen: there are no signs or vegative symptoms of depression- irritability, change in appetite, anhedonia, sadness/tearfullness.  The following portions of the patient's history were reviewed and updated as appropriate: allergies, current medications, past family history, past medical history,  past surgical history, past social history  and problem list.  Visual acuity was not assessed per patient preference since she has regular follow up with her ophthalmologist. Hearing and body mass index were assessed and reviewed.   During the  course of the visit the patient was educated and counseled about appropriate screening and preventive services including : fall prevention , diabetes screening, nutrition counseling, colorectal cancer screening, and recommended immunizations.    CC: The primary encounter diagnosis was Breast cancer screening. Diagnoses of Cervical cancer screening, Encounter for preventive health examination, Mild exercise-induced asthma, Hyperlipidemia with target LDL less than 160, and Thalassemia carrier were also pertinent to this visit.   She is exercising regularly on a treadmill several days per week for the past 3 years, but has been unable to increase her pace because of shortness of breath.  She states that she develops chest tightness and cough. She typically rus  adistanceof a  Mile, then has to walk for a bit before she can resume running .    History of reflux induced asthma.  Gets post nasal drip a lot, which causes her to become hoarse.  At times she feels as though she is strangling.   History Amy Lara has a past medical history of Asthma; GERD (gastroesophageal reflux disease); Allergy; Hyperlipidemia; Kidney stones; Fibrocystic breast disease in female; Plantar fasciitis, bilateral; Acquired pes planus of both feet; and Thalassemia carrier.   She has past surgical history that includes Tubal ligation.   Her family history includes Cancer in her father and paternal uncle; Heart disease in her paternal grandmother; Hyperlipidemia in her father, mother, paternal aunt, paternal grandfather, and paternal uncle.She reports that she has never smoked. She has never used  smokeless tobacco. She reports that she drinks alcohol. She reports that she does not use illicit drugs.  Outpatient Prescriptions Prior to Visit  Medication Sig Dispense Refill  . clindamycin (CLEOCIN) 300 MG capsule Take 1 capsule (300 mg total) by mouth 3 (three) times daily. 30 capsule 0  . desonide (DESOWEN) 0.05 % ointment Apply 1  application topically 2 (two) times daily. 15 g 0   No facility-administered medications prior to visit.    Review of Systems   Patient denies headache, fevers, malaise, unintentional weight loss, skin rash, eye pain, sinus congestion and sinus pain, sore throat, dysphagia,  hemoptysis , cough, dyspnea, wheezing, chest pain, palpitations, orthopnea, edema, abdominal pain, nausea, melena, diarrhea, constipation, flank pain, dysuria, hematuria, urinary  Frequency, nocturia, numbness, tingling, seizures,  Focal weakness, Loss of consciousness,  Tremor, insomnia, depression, anxiety, and suicidal ideation.      Objective:  BP 120/82 mmHg  Pulse 88  Temp(Src) 98.5 F (36.9 C) (Oral)  Resp 12  Ht 5' 1.5" (1.562 m)  Wt 173 lb (78.472 kg)  BMI 32.16 kg/m2  SpO2 98%  LMP 12/27/2015  Physical Exam  General Appearance:    Alert, cooperative, no distress, appears stated age  Head:    Normocephalic, without obvious abnormality, atraumatic  Eyes:    PERRL, conjunctiva/corneas clear, EOM's intact, fundi    benign, both eyes  Ears:    Normal TM's and external ear canals, both ears  Nose:   Nares normal, septum midline, mucosa normal, no drainage    or sinus tenderness  Throat:   Lips, mucosa, and tongue normal; teeth and gums normal  Neck:   Supple, symmetrical, trachea midline, no adenopathy;    thyroid:  no enlargement/tenderness/nodules; no carotid   bruit or JVD  Back:     Symmetric, no curvature, ROM normal, no CVA tenderness  Lungs:     Clear to auscultation bilaterally, respirations unlabored  Chest Wall:    No tenderness or deformity   Heart:    Regular rate and rhythm, S1 and S2 normal, no murmur, rub   or gallop  Breast Exam:    No tenderness, masses, or nipple abnormality  Abdomen:     Soft, non-tender, bowel sounds active all four quadrants,    no masses, no organomegaly  Genitalia:    Pelvic: cervix normal in appearance, external genitalia normal, no adnexal masses or  tenderness, no cervical motion tenderness, rectovaginal septum normal, uterus normal size, shape, and consistency and vagina normal without discharge  Extremities:   Extremities normal, atraumatic, no cyanosis or edema  Pulses:   2+ and symmetric all extremities  Skin:   Skin color, texture, turgor normal, no rashes or lesions  Lymph nodes:   Cervical, supraclavicular, and axillary nodes normal  Neurologic:   CNII-XII intact, normal strength, sensation and reflexes    throughout      Assessment & Plan:   Problem List Items Addressed This Visit    Thalassemia carrier    Repeat CBC done, no anemia,  Just microcytosis      Hyperlipidemia with target LDL less than 160    Mild, with no other cardiac risk factors .  No treatment indicated.      Encounter for preventive health examination    Annual comprehensive preventive exam was done as well as an evaluation and management of chronic conditions .  During the course of the visit the patient was educated and counseled about appropriate screening and preventive services including :  diabetes screening, lipid analysis with projected  10 year  risk for CAD , nutrition counseling, breast, cervical and colorectal cancer screening, and recommended immunizations.  Printed recommendations for health maintenance screenings was given.  Lab Results  Component Value Date   ALT 11 01/14/2016   AST 12 01/14/2016   ALKPHOS 68 01/14/2016   BILITOT 0.4 01/14/2016   Lab Results  Component Value Date   CHOL 237* 01/14/2016   HDL 61.40 01/14/2016   LDLCALC 146* 01/14/2016   LDLDIRECT 112.5 12/30/2012   TRIG 147.0 01/14/2016   CHOLHDL 4 01/14/2016         Mild exercise-induced asthma    Suggested by history , but she has not been treating her allergies or reflux either.  Will treat both and reassess       Breast cancer screening - Primary    3d mammogram ordered.      Relevant Orders   MM DIGITAL SCREENING BILATERAL    Other Visit  Diagnoses    Cervical cancer screening           I have discontinued Ms. Stracener's clindamycin and desonide. I am also having her start on omeprazole.  Meds ordered this encounter  Medications  . omeprazole (PRILOSEC) 40 MG capsule    Sig: Take 1 capsule (40 mg total) by mouth daily.    Dispense:  30 capsule    Refill:  3    Medications Discontinued During This Encounter  Medication Reason  . desonide (DESOWEN) 0.05 % ointment Completed Course  . clindamycin (CLEOCIN) 300 MG capsule Completed Course    Follow-up: No Follow-up on file.   Sherlene ShamsULLO, Arriah Wadle L, MD

## 2016-01-17 NOTE — Patient Instructions (Addendum)
Trial of daily omeprazole in the AM  .  And daily Allegra (fexofenadine) in the AM  Let me know in 2 weeks if symptoms improve  Health Maintenance, Female Adopting a healthy lifestyle and getting preventive care can go a long way to promote health and wellness. Talk with your health care provider about what schedule of regular examinations is right for you. This is a good chance for you to check in with your provider about disease prevention and staying healthy. In between checkups, there are plenty of things you can do on your own. Experts have done a lot of research about which lifestyle changes and preventive measures are most likely to keep you healthy. Ask your health care provider for more information. WEIGHT AND DIET  Eat a healthy diet  Be sure to include plenty of vegetables, fruits, low-fat dairy products, and lean protein.  Do not eat a lot of foods high in solid fats, added sugars, or salt.  Get regular exercise. This is one of the most important things you can do for your health.  Most adults should exercise for at least 150 minutes each week. The exercise should increase your heart rate and make you sweat (moderate-intensity exercise).  Most adults should also do strengthening exercises at least twice a week. This is in addition to the moderate-intensity exercise.  Maintain a healthy weight  Body mass index (BMI) is a measurement that can be used to identify possible weight problems. It estimates body fat based on height and weight. Your health care provider can help determine your BMI and help you achieve or maintain a healthy weight.  For females 21 years of age and older:   A BMI below 18.5 is considered underweight.  A BMI of 18.5 to 24.9 is normal.  A BMI of 25 to 29.9 is considered overweight.  A BMI of 30 and above is considered obese.  Watch levels of cholesterol and blood lipids  You should start having your blood tested for lipids and cholesterol at 40  years of age, then have this test every 5 years.  You may need to have your cholesterol levels checked more often if:  Your lipid or cholesterol levels are high.  You are older than 40 years of age.  You are at high risk for heart disease.  CANCER SCREENING   Lung Cancer  Lung cancer screening is recommended for adults 16-73 years old who are at high risk for lung cancer because of a history of smoking.  A yearly low-dose CT scan of the lungs is recommended for people who:  Currently smoke.  Have quit within the past 15 years.  Have at least a 30-pack-year history of smoking. A pack year is smoking an average of one pack of cigarettes a day for 1 year.  Yearly screening should continue until it has been 15 years since you quit.  Yearly screening should stop if you develop a health problem that would prevent you from having lung cancer treatment.  Breast Cancer  Practice breast self-awareness. This means understanding how your breasts normally appear and feel.  It also means doing regular breast self-exams. Let your health care provider know about any changes, no matter how small.  If you are in your 20s or 30s, you should have a clinical breast exam (CBE) by a health care provider every 1-3 years as part of a regular health exam.  If you are 65 or older, have a CBE every year. Also consider having  a breast X-ray (mammogram) every year.  If you have a family history of breast cancer, talk to your health care provider about genetic screening.  If you are at high risk for breast cancer, talk to your health care provider about having an MRI and a mammogram every year.  Breast cancer gene (BRCA) assessment is recommended for women who have family members with BRCA-related cancers. BRCA-related cancers include:  Breast.  Ovarian.  Tubal.  Peritoneal cancers.  Results of the assessment will determine the need for genetic counseling and BRCA1 and BRCA2 testing. Cervical  Cancer Your health care provider may recommend that you be screened regularly for cancer of the pelvic organs (ovaries, uterus, and vagina). This screening involves a pelvic examination, including checking for microscopic changes to the surface of your cervix (Pap test). You may be encouraged to have this screening done every 3 years, beginning at age 31.  For women ages 72-65, health care providers may recommend pelvic exams and Pap testing every 3 years, or they may recommend the Pap and pelvic exam, combined with testing for human papilloma virus (HPV), every 5 years. Some types of HPV increase your risk of cervical cancer. Testing for HPV may also be done on women of any age with unclear Pap test results.  Other health care providers may not recommend any screening for nonpregnant women who are considered low risk for pelvic cancer and who do not have symptoms. Ask your health care provider if a screening pelvic exam is right for you.  If you have had past treatment for cervical cancer or a condition that could lead to cancer, you need Pap tests and screening for cancer for at least 20 years after your treatment. If Pap tests have been discontinued, your risk factors (such as having a new sexual partner) need to be reassessed to determine if screening should resume. Some women have medical problems that increase the chance of getting cervical cancer. In these cases, your health care provider may recommend more frequent screening and Pap tests. Colorectal Cancer  This type of cancer can be detected and often prevented.  Routine colorectal cancer screening usually begins at 40 years of age and continues through 40 years of age.  Your health care provider may recommend screening at an earlier age if you have risk factors for colon cancer.  Your health care provider may also recommend using home test kits to check for hidden blood in the stool.  A small camera at the end of a tube can be used to  examine your colon directly (sigmoidoscopy or colonoscopy). This is done to check for the earliest forms of colorectal cancer.  Routine screening usually begins at age 40.  Direct examination of the colon should be repeated every 5-10 years through 40 years of age. However, you may need to be screened more often if early forms of precancerous polyps or small growths are found. Skin Cancer  Check your skin from head to toe regularly.  Tell your health care provider about any new moles or changes in moles, especially if there is a change in a mole's shape or color.  Also tell your health care provider if you have a mole that is larger than the size of a pencil eraser.  Always use sunscreen. Apply sunscreen liberally and repeatedly throughout the day.  Protect yourself by wearing long sleeves, pants, a wide-brimmed hat, and sunglasses whenever you are outside. HEART DISEASE, DIABETES, AND HIGH BLOOD PRESSURE   High blood pressure  causes heart disease and increases the risk of stroke. High blood pressure is more likely to develop in:  People who have blood pressure in the high end of the normal range (130-139/85-89 mm Hg).  People who are overweight or obese.  People who are African American.  If you are 23-32 years of age, have your blood pressure checked every 3-5 years. If you are 11 years of age or older, have your blood pressure checked every year. You should have your blood pressure measured twice--once when you are at a hospital or clinic, and once when you are not at a hospital or clinic. Record the average of the two measurements. To check your blood pressure when you are not at a hospital or clinic, you can use:  An automated blood pressure machine at a pharmacy.  A home blood pressure monitor.  If you are between 89 years and 2 years old, ask your health care provider if you should take aspirin to prevent strokes.  Have regular diabetes screenings. This involves taking a  blood sample to check your fasting blood sugar level.  If you are at a normal weight and have a low risk for diabetes, have this test once every three years after 40 years of age.  If you are overweight and have a high risk for diabetes, consider being tested at a younger age or more often. PREVENTING INFECTION  Hepatitis B  If you have a higher risk for hepatitis B, you should be screened for this virus. You are considered at high risk for hepatitis B if:  You were born in a country where hepatitis B is common. Ask your health care provider which countries are considered high risk.  Your parents were born in a high-risk country, and you have not been immunized against hepatitis B (hepatitis B vaccine).  You have HIV or AIDS.  You use needles to inject street drugs.  You live with someone who has hepatitis B.  You have had sex with someone who has hepatitis B.  You get hemodialysis treatment.  You take certain medicines for conditions, including cancer, organ transplantation, and autoimmune conditions. Hepatitis C  Blood testing is recommended for:  Everyone born from [birth year] through 1965.  Anyone with known risk factors for hepatitis C. Sexually transmitted infections (STIs)  You should be screened for sexually transmitted infections (STIs) including gonorrhea and chlamydia if:  You are sexually active and are younger than 40 years of age.  You are older than 40 years of age and your health care provider tells you that you are at risk for this type of infection.  Your sexual activity has changed since you were last screened and you are at an increased risk for chlamydia or gonorrhea. Ask your health care provider if you are at risk.  If you do not have HIV, but are at risk, it may be recommended that you take a prescription medicine daily to prevent HIV infection. This is called pre-exposure prophylaxis (PrEP). You are considered at risk if:  You are sexually active and do  not regularly use condoms or know the HIV status of your partner(s).  You take drugs by injection.  You are sexually active with a partner who has HIV. Talk with your health care provider about whether you are at high risk of being infected with HIV. If you choose to begin PrEP, you should first be tested for HIV. You should then be tested every 3 months for as long as you  are taking PrEP.  PREGNANCY   If you are premenopausal and you may become pregnant, ask your health care provider about preconception counseling.  If you may become pregnant, take 400 to 800 micrograms (mcg) of folic acid every day.  If you want to prevent pregnancy, talk to your health care provider about birth control (contraception). OSTEOPOROSIS AND MENOPAUSE   Osteoporosis is a disease in which the bones lose minerals and strength with aging. This can result in serious bone fractures. Your risk for osteoporosis can be identified using a bone density scan.  If you are 24 years of age or older, or if you are at risk for osteoporosis and fractures, ask your health care provider if you should be screened.  Ask your health care provider whether you should take a calcium or vitamin D supplement to lower your risk for osteoporosis.  Menopause may have certain physical symptoms and risks.  Hormone replacement therapy may reduce some of these symptoms and risks. Talk to your health care provider about whether hormone replacement therapy is right for you.  HOME CARE INSTRUCTIONS   Schedule regular health, dental, and eye exams.  Stay current with your immunizations.   Do not use any tobacco products including cigarettes, chewing tobacco, or electronic cigarettes.  If you are pregnant, do not drink alcohol.  If you are breastfeeding, limit how much and how often you drink alcohol.  Limit alcohol intake to no more than 1 drink per day for nonpregnant women. One drink equals 12 ounces of beer, 5 ounces of wine, or 1  ounces of hard liquor.  Do not use street drugs.  Do not share needles.  Ask your health care provider for help if you need support or information about quitting drugs.  Tell your health care provider if you often feel depressed.  Tell your health care provider if you have ever been abused or do not feel safe at home.   This information is not intended to replace advice given to you by your health care provider. Make sure you discuss any questions you have with your health care provider.   Document Released: 01/16/2011 Document Revised: 07/24/2014 Document Reviewed: 06/04/2013 Elsevier Interactive Patient Education [employer].

## 2016-01-17 NOTE — Progress Notes (Signed)
Pre-visit discussion using our clinic review tool. No additional management support is needed unless otherwise documented below in the visit note.  

## 2016-01-18 DIAGNOSIS — Z1239 Encounter for other screening for malignant neoplasm of breast: Secondary | ICD-10-CM | POA: Insufficient documentation

## 2016-01-18 DIAGNOSIS — J45990 Exercise induced bronchospasm: Secondary | ICD-10-CM | POA: Insufficient documentation

## 2016-01-18 NOTE — Assessment & Plan Note (Signed)
3d mammogram ordered

## 2016-01-18 NOTE — Assessment & Plan Note (Signed)
Suggested by history , but she has not been treating her allergies or reflux either.  Will treat both and reassess

## 2016-01-18 NOTE — Assessment & Plan Note (Signed)
Repeat CBC done, no anemia,  Just microcytosis

## 2016-01-18 NOTE — Assessment & Plan Note (Signed)
Mild, with no other cardiac risk factors .  No treatment indicated.

## 2016-01-18 NOTE — Assessment & Plan Note (Signed)
Annual comprehensive preventive exam was done as well as an evaluation and management of chronic conditions .  During the course of the visit the patient was educated and counseled about appropriate screening and preventive services including :  diabetes screening, lipid analysis with projected  10 year  risk for CAD , nutrition counseling, breast, cervical and colorectal cancer screening, and recommended immunizations.  Printed recommendations for health maintenance screenings was given.  Lab Results  Component Value Date   ALT 11 01/14/2016   AST 12 01/14/2016   ALKPHOS 68 01/14/2016   BILITOT 0.4 01/14/2016   Lab Results  Component Value Date   CHOL 237* 01/14/2016   HDL 61.40 01/14/2016   LDLCALC 146* 01/14/2016   LDLDIRECT 112.5 12/30/2012   TRIG 147.0 01/14/2016   CHOLHDL 4 01/14/2016

## 2016-01-19 LAB — CYTOLOGY - PAP

## 2016-01-20 ENCOUNTER — Encounter: Payer: Self-pay | Admitting: Internal Medicine

## 2016-04-19 ENCOUNTER — Encounter: Payer: Self-pay | Admitting: Internal Medicine

## 2016-04-28 ENCOUNTER — Other Ambulatory Visit: Payer: Self-pay | Admitting: Internal Medicine

## 2016-04-28 ENCOUNTER — Ambulatory Visit
Admission: RE | Admit: 2016-04-28 | Discharge: 2016-04-28 | Disposition: A | Discharge status: Home / Self Care | Payer: 59 | Source: Ambulatory Visit | Attending: Internal Medicine | Admitting: Internal Medicine

## 2016-04-28 DIAGNOSIS — Z1231 Encounter for screening mammogram for malignant neoplasm of breast: Secondary | ICD-10-CM | POA: Diagnosis present

## 2016-04-28 DIAGNOSIS — N6489 Other specified disorders of breast: Secondary | ICD-10-CM

## 2016-04-28 DIAGNOSIS — Z1239 Encounter for other screening for malignant neoplasm of breast: Secondary | ICD-10-CM

## 2016-05-05 ENCOUNTER — Ambulatory Visit
Admission: RE | Admit: 2016-05-05 | Discharge: 2016-05-05 | Disposition: A | Discharge status: Home / Self Care | Payer: 59 | Source: Ambulatory Visit | Attending: Internal Medicine | Admitting: Internal Medicine

## 2016-05-05 DIAGNOSIS — N6489 Other specified disorders of breast: Secondary | ICD-10-CM

## 2016-05-17 ENCOUNTER — Other Ambulatory Visit: Payer: 59

## 2016-05-17 ENCOUNTER — Ambulatory Visit: Payer: 59

## 2017-08-02 DIAGNOSIS — N6082 Other benign mammary dysplasias of left breast: Secondary | ICD-10-CM | POA: Diagnosis not present

## 2017-08-02 DIAGNOSIS — B078 Other viral warts: Secondary | ICD-10-CM | POA: Diagnosis not present

## 2017-08-02 DIAGNOSIS — M6740 Ganglion, unspecified site: Secondary | ICD-10-CM | POA: Diagnosis not present

## 2017-08-16 IMAGING — MG 2D DIGITAL DIAGNOSTIC UNILATERAL LEFT MAMMOGRAM WITH CAD AND ADJ
8 of 16 series · 8 of 36 positions shown · non-contrast
Comparison: Previous exam(s).

CLINICAL DATA: Patient recalled from screening for left breast
asymmetry.

EXAM:
2D DIGITAL DIAGNOSTIC LEFT MAMMOGRAM WITH CAD AND ADJUNCT TOMO
ULTRASOUND LEFT BREAST

[L ML (1 of 2)]
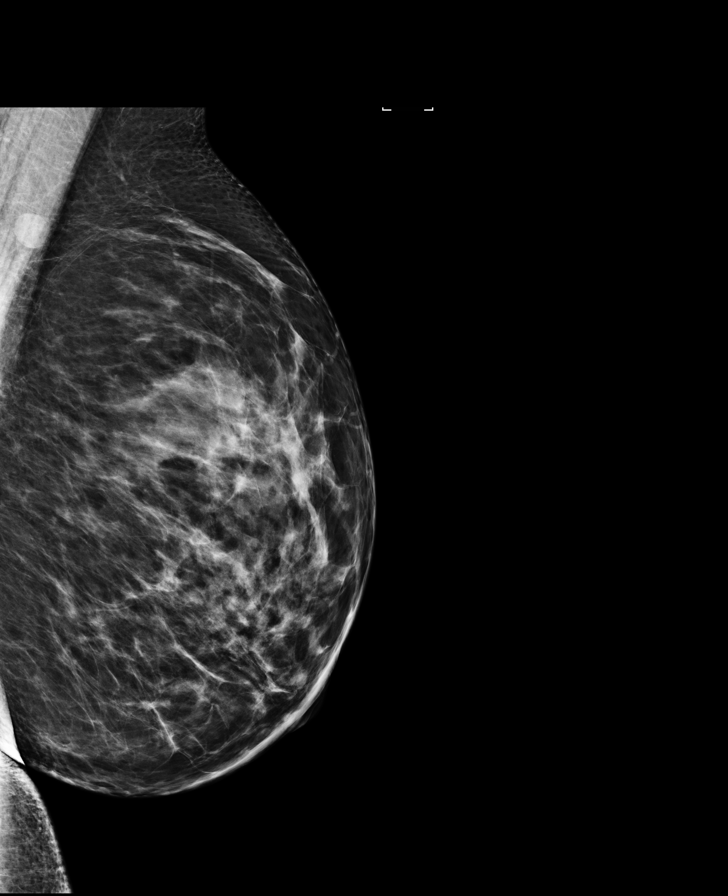

[L XCCL synth-2D]
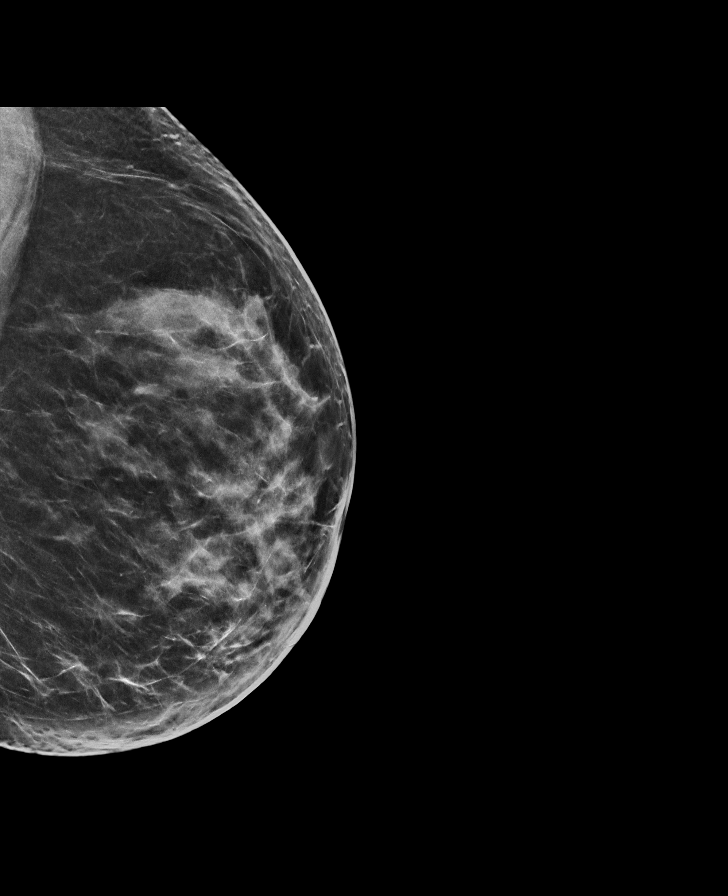

[L ML synth-2D]
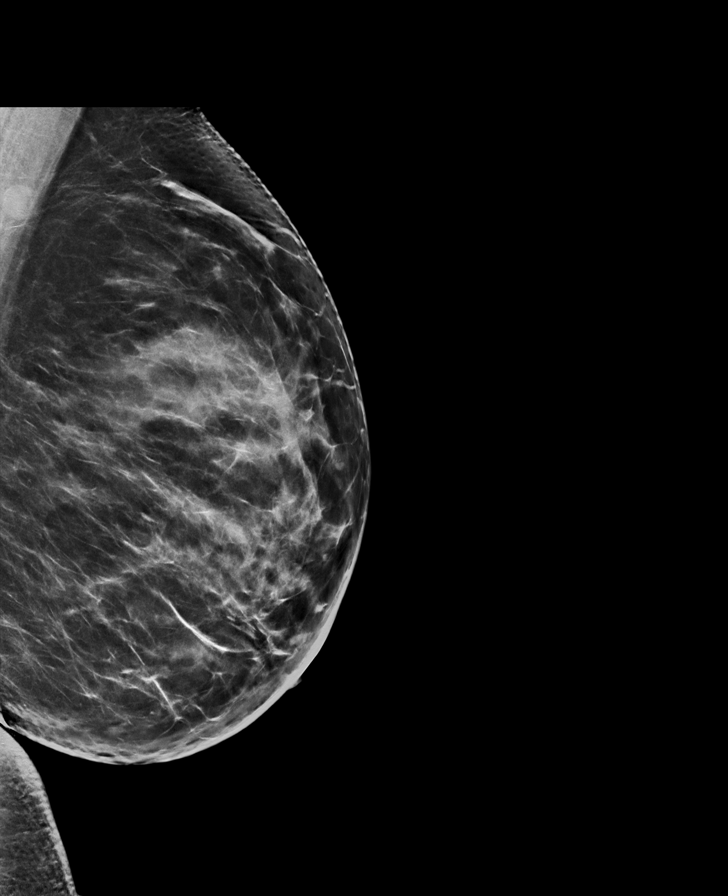

[L MLO (1 of 2)]
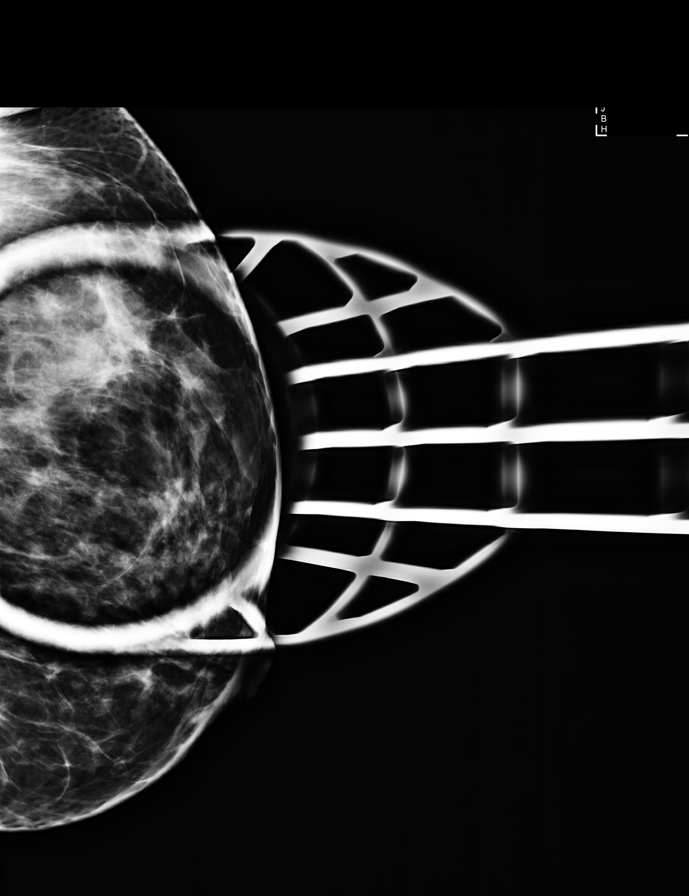

[L MLO (2 of 2)]
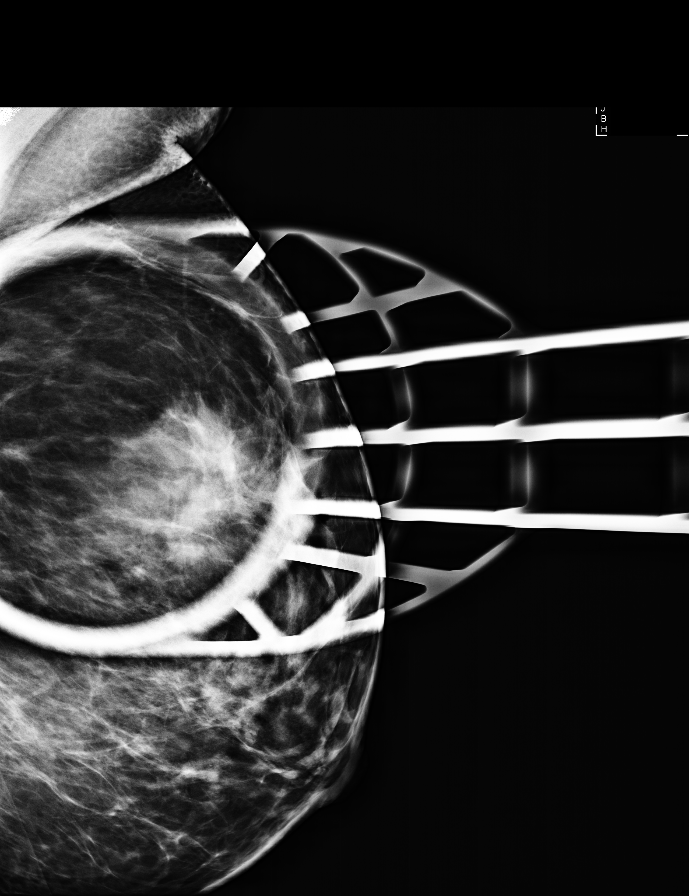

[L CC]
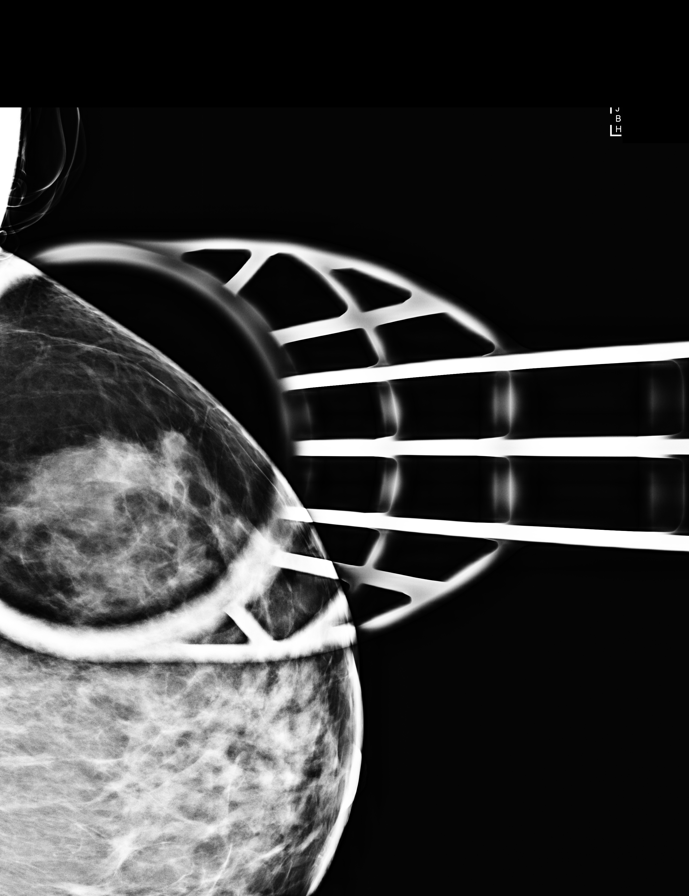

[L ML (2 of 2)]
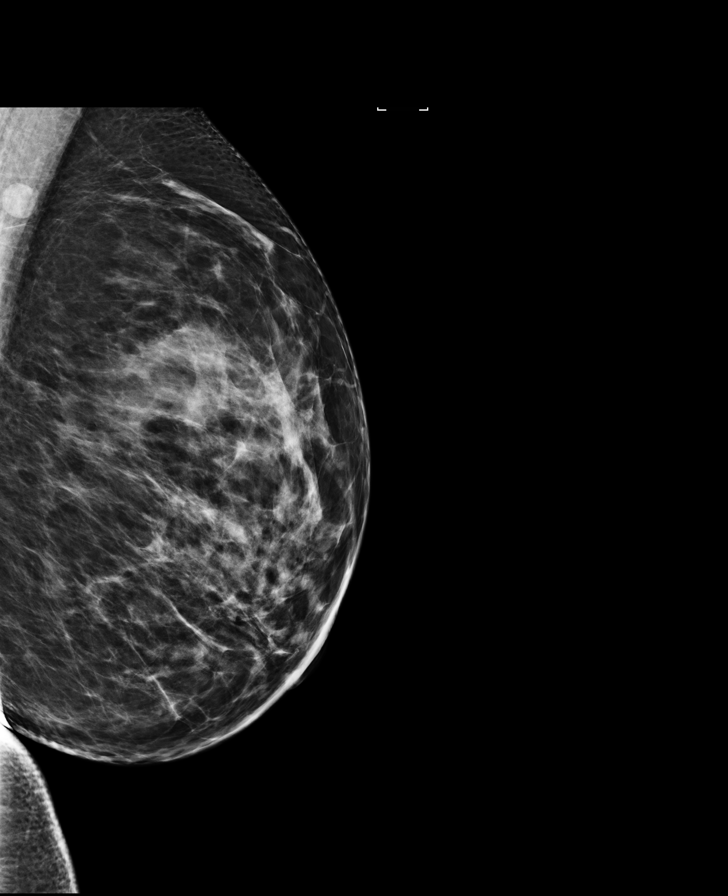

[L XCCL]
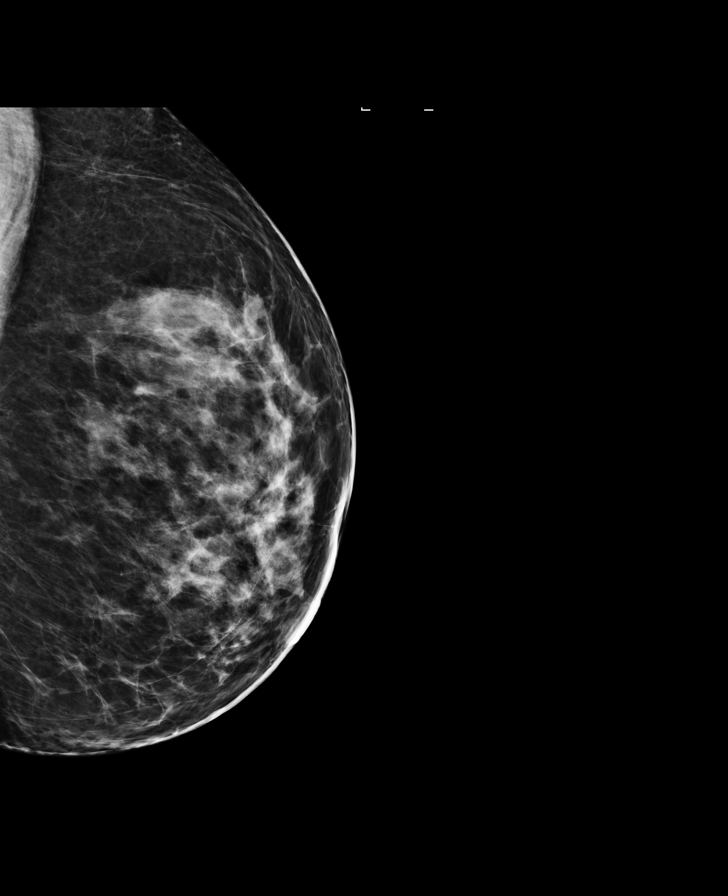

[8 of 36 positions shown; findings below may reference images not displayed]

ACR Breast Density Category c: The breast tissue is heterogeneously
dense, which may obscure small masses.
FINDINGS: Spot compression tomosynthesis CC and MLO views as well as full
paddle exaggerated CC lateral true lateral views the left breast
were obtained. Questioned asymmetry within the left breast
predominately resolved. Left breast fibroglandular pattern is stable
dating back to [DATE]. Additionally there is a 12 mm oval
circumscribed mass within the upper inner left breast.

Mammographic images were processed with CAD.

On physical exam, I palpate no discrete mass within the lateral left
breast. Within the upper inner left breast there is a small
subcutaneous palpable mass with cutaneous punctum.

Targeted ultrasound is performed, showing a 9 x 6 x 8 mm sebaceous
cyst within the left breast 10 o'clock position 7 cm from the
nipple. No suspicious abnormality identified within the lateral
aspect of the left breast at the site of questioned asymmetry.
IMPRESSION: No mammographic evidence for malignancy.

Left breast sebaceous cyst.

RECOMMENDATION:
Screening mammogram in one year.(Code:[id number]-[id number])

I have discussed the findings and recommendations with the patient.
Results were also provided in writing at the conclusion of the
visit. If applicable, a reminder letter will be sent to the patient
regarding the next appointment.

BI-RADS CATEGORY  2: Benign.

## 2017-08-28 DIAGNOSIS — M67431 Ganglion, right wrist: Secondary | ICD-10-CM | POA: Diagnosis not present

## 2017-09-11 DIAGNOSIS — L720 Epidermal cyst: Secondary | ICD-10-CM | POA: Diagnosis not present

## 2017-09-11 DIAGNOSIS — N6082 Other benign mammary dysplasias of left breast: Secondary | ICD-10-CM | POA: Diagnosis not present

## 2018-02-15 DIAGNOSIS — J029 Acute pharyngitis, unspecified: Secondary | ICD-10-CM | POA: Diagnosis not present

## 2018-02-15 DIAGNOSIS — H66001 Acute suppurative otitis media without spontaneous rupture of ear drum, right ear: Secondary | ICD-10-CM | POA: Diagnosis not present

## 2018-05-13 ENCOUNTER — Encounter: Payer: Self-pay | Admitting: Internal Medicine

## 2018-05-13 ENCOUNTER — Ambulatory Visit (INDEPENDENT_AMBULATORY_CARE_PROVIDER_SITE_OTHER): Payer: 59 | Admitting: Internal Medicine

## 2018-05-13 ENCOUNTER — Other Ambulatory Visit: Payer: 59

## 2018-05-13 VITALS — BP 116/80 | HR 96 | Temp 98.7°F | Resp 15 | Ht 61.5 in | Wt 179.8 lb

## 2018-05-13 DIAGNOSIS — R7301 Impaired fasting glucose: Secondary | ICD-10-CM

## 2018-05-13 DIAGNOSIS — Z1239 Encounter for other screening for malignant neoplasm of breast: Secondary | ICD-10-CM | POA: Diagnosis not present

## 2018-05-13 DIAGNOSIS — R002 Palpitations: Secondary | ICD-10-CM | POA: Diagnosis not present

## 2018-05-13 DIAGNOSIS — E785 Hyperlipidemia, unspecified: Secondary | ICD-10-CM | POA: Diagnosis not present

## 2018-05-13 DIAGNOSIS — E669 Obesity, unspecified: Secondary | ICD-10-CM

## 2018-05-13 DIAGNOSIS — Z0000 Encounter for general adult medical examination without abnormal findings: Secondary | ICD-10-CM

## 2018-05-13 LAB — COMPREHENSIVE METABOLIC PANEL
ALK PHOS: 68 U/L (ref 39–117)
ALT: 13 U/L (ref 0–35)
AST: 12 U/L (ref 0–37)
Albumin: 4.1 g/dL (ref 3.5–5.2)
BUN: 12 mg/dL (ref 6–23)
CO2: 29 mEq/L (ref 19–32)
Calcium: 9.3 mg/dL (ref 8.4–10.5)
Chloride: 103 mEq/L (ref 96–112)
Creatinine, Ser: 0.73 mg/dL (ref 0.40–1.20)
GFR: 92.66 mL/min (ref >60.00)
Glucose, Bld: 86 mg/dL (ref 70–99)
POTASSIUM: 4.4 meq/L (ref 3.5–5.1)
Sodium: 140 mEq/L (ref 135–145)
TOTAL PROTEIN: 7.5 g/dL (ref 6.0–8.3)
Total Bilirubin: 0.3 mg/dL (ref 0.2–1.2)

## 2018-05-13 LAB — TSH: TSH: 1.11 u[IU]/mL (ref 0.35–4.50)

## 2018-05-13 LAB — LIPID PANEL
CHOLESTEROL: 224 mg/dL — AB (ref 0–200)
HDL: 54.1 mg/dL (ref >39.00)
LDL Cholesterol: 136 mg/dL — ABNORMAL HIGH (ref 0–99)
NonHDL: 169.79
Total CHOL/HDL Ratio: 4
Triglycerides: 167 mg/dL — ABNORMAL HIGH (ref 0.0–149.0)
VLDL: 33.4 mg/dL (ref 0.0–40.0)

## 2018-05-13 NOTE — Patient Instructions (Addendum)
Your Goal  Weight is  160 lbs   To get BMI < 30 Portion size is   everything !  PLUS YOU NEED A MINIMUM OF 30 minutes of aerobic 5 TIMES WEEKLY    Your annual mammogram has been ordered.  You are encouraged (required) to call to make your appointment at [facility]   Recommend to your mother that she get a sleep study to rule out sleep apnea   Health Maintenance, Female Adopting a healthy lifestyle and getting preventive care can go a long way to promote health and wellness. Talk with your health care provider about what schedule of regular examinations is right for you. This is a good chance for you to check in with your provider about disease prevention and staying healthy. In between checkups, there are plenty of things you can do on your own. Experts have done a lot of research about which lifestyle changes and preventive measures are most likely to keep you healthy. Ask your health care provider for more information. Weight and diet Eat a healthy diet  Be sure to include plenty of vegetables, fruits, low-fat dairy products, and lean protein.  Do not eat a lot of foods high in solid fats, added sugars, or salt.  Get regular exercise. This is one of the most important things you can do for your health. ? Most adults should exercise for at least 150 minutes each week. The exercise should increase your heart rate and make you sweat (moderate-intensity exercise). ? Most adults should also do strengthening exercises at least twice a week. This is in addition to the moderate-intensity exercise.  Maintain a healthy weight  Body mass index (BMI) is a measurement that can be used to identify possible weight problems. It estimates body fat based on height and weight. Your health care provider can help determine your BMI and help you achieve or maintain a healthy weight.  For females 44 years of age and older: ? A BMI below 18.5 is considered underweight. ? A BMI of 18.5 to 24.9 is  normal. ? A BMI of 25 to 29.9 is considered overweight. ? A BMI of 30 and above is considered obese.  Watch levels of cholesterol and blood lipids  You should start having your blood tested for lipids and cholesterol at 42 years of age, then have this test every 5 years.  You may need to have your cholesterol levels checked more often if: ? Your lipid or cholesterol levels are high. ? You are older than 42 years of age. ? You are at high risk for heart disease.  Cancer screening Lung Cancer  Lung cancer screening is recommended for adults 99-34 years old who are at high risk for lung cancer because of a history of smoking.  A yearly low-dose CT scan of the lungs is recommended for people who: ? Currently smoke. ? Have quit within the past 15 years. ? Have at least a 30-pack-year history of smoking. A pack year is smoking an average of one pack of cigarettes a day for 1 year.  Yearly screening should continue until it has been 15 years since you quit.  Yearly screening should stop if you develop a health problem that would prevent you from having lung cancer treatment.  Breast Cancer  Practice breast self-awareness. This means understanding how your breasts normally appear and feel.  It also means doing regular breast self-exams. Let your health care provider know about any changes, no matter how  small.  If you are in your 20s or 30s, you should have a clinical breast exam (CBE) by a health care provider every 1-3 years as part of a regular health exam.  If you are 40 or older, have a CBE every year. Also consider having a breast X-ray (mammogram) every year.  If you have a family history of breast cancer, talk to your health care provider about genetic screening.  If you are at high risk for breast cancer, talk to your health care provider about having an MRI and a mammogram every year.  Breast cancer gene (BRCA) assessment is recommended for women who have family members  with BRCA-related cancers. BRCA-related cancers include: ? Breast. ? Ovarian. ? Tubal. ? Peritoneal cancers.  Results of the assessment will determine the need for genetic counseling and BRCA1 and BRCA2 testing.  Cervical Cancer Your health care provider may recommend that you be screened regularly for cancer of the pelvic organs (ovaries, uterus, and vagina). This screening involves a pelvic examination, including checking for microscopic changes to the surface of your cervix (Pap test). You may be encouraged to have this screening done every 3 years, beginning at age 74.  For women ages 66-65, health care providers may recommend pelvic exams and Pap testing every 3 years, or they may recommend the Pap and pelvic exam, combined with testing for human papilloma virus (HPV), every 5 years. Some types of HPV increase your risk of cervical cancer. Testing for HPV may also be done on women of any age with unclear Pap test results.  Other health care providers may not recommend any screening for nonpregnant women who are considered low risk for pelvic cancer and who do not have symptoms. Ask your health care provider if a screening pelvic exam is right for you.  If you have had past treatment for cervical cancer or a condition that could lead to cancer, you need Pap tests and screening for cancer for at least 20 years after your treatment. If Pap tests have been discontinued, your risk factors (such as having a new sexual partner) need to be reassessed to determine if screening should resume. Some women have medical problems that increase the chance of getting cervical cancer. In these cases, your health care provider may recommend more frequent screening and Pap tests.  Colorectal Cancer  This type of cancer can be detected and often prevented.  Routine colorectal cancer screening usually begins at 42 years of age and continues through 42 years of age.  Your health care provider may recommend  screening at an earlier age if you have risk factors for colon cancer.  Your health care provider may also recommend using home test kits to check for hidden blood in the stool.  A small camera at the end of a tube can be used to examine your colon directly (sigmoidoscopy or colonoscopy). This is done to check for the earliest forms of colorectal cancer.  Routine screening usually begins at age 106.  Direct examination of the colon should be repeated every 5-10 years through 42 years of age. However, you may need to be screened more often if early forms of precancerous polyps or small growths are found.  Skin Cancer  Check your skin from head to toe regularly.  Tell your health care provider about any new moles or changes in moles, especially if there is a change in a mole's shape or color.  Also tell your health care provider if you have a mole  that is larger than the size of a pencil eraser.  Always use sunscreen. Apply sunscreen liberally and repeatedly throughout the day.  Protect yourself by wearing long sleeves, pants, a wide-brimmed hat, and sunglasses whenever you are outside.  Heart disease, diabetes, and high blood pressure  High blood pressure causes heart disease and increases the risk of stroke. High blood pressure is more likely to develop in: ? People who have blood pressure in the high end of the normal range (130-139/85-89 mm Hg). ? People who are overweight or obese. ? People who are African American.  If you are 56-2 years of age, have your blood pressure checked every 3-5 years. If you are 59 years of age or older, have your blood pressure checked every year. You should have your blood pressure measured twice-once when you are at a hospital or clinic, and once when you are not at a hospital or clinic. Record the average of the two measurements. To check your blood pressure when you are not at a hospital or clinic, you can use: ? An automated blood pressure machine at  a pharmacy. ? A home blood pressure monitor.  If you are between 42 years and 52 years old, ask your health care provider if you should take aspirin to prevent strokes.  Have regular diabetes screenings. This involves taking a blood sample to check your fasting blood sugar level. ? If you are at a normal weight and have a low risk for diabetes, have this test once every three years after 42 years of age. ? If you are overweight and have a high risk for diabetes, consider being tested at a younger age or more often. Preventing infection Hepatitis B  If you have a higher risk for hepatitis B, you should be screened for this virus. You are considered at high risk for hepatitis B if: ? You were born in a country where hepatitis B is common. Ask your health care provider which countries are considered high risk. ? Your parents were born in a high-risk country, and you have not been immunized against hepatitis B (hepatitis B vaccine). ? You have HIV or AIDS. ? You use needles to inject street drugs. ? You live with someone who has hepatitis B. ? You have had sex with someone who has hepatitis B. ? You get hemodialysis treatment. ? You take certain medicines for conditions, including cancer, organ transplantation, and autoimmune conditions.  Hepatitis C  Blood testing is recommended for: ? Everyone born from [birth year] through 1965. ? Anyone with known risk factors for hepatitis C.  Sexually transmitted infections (STIs)  You should be screened for sexually transmitted infections (STIs) including gonorrhea and chlamydia if: ? You are sexually active and are younger than 42 years of age. ? You are older than 42 years of age and your health care provider tells you that you are at risk for this type of infection. ? Your sexual activity has changed since you were last screened and you are at an increased risk for chlamydia or gonorrhea. Ask your health care provider if you are at risk.  If you do  not have HIV, but are at risk, it may be recommended that you take a prescription medicine daily to prevent HIV infection. This is called pre-exposure prophylaxis (PrEP). You are considered at risk if: ? You are sexually active and do not regularly use condoms or know the HIV status of your partner(s). ? You take drugs by injection. ? You are  sexually active with a partner who has HIV.  Talk with your health care provider about whether you are at high risk of being infected with HIV. If you choose to begin PrEP, you should first be tested for HIV. You should then be tested every 3 months for as long as you are taking PrEP. Pregnancy  If you are premenopausal and you may become pregnant, ask your health care provider about preconception counseling.  If you may become pregnant, take 400 to 800 micrograms (mcg) of folic acid every day.  If you want to prevent pregnancy, talk to your health care provider about birth control (contraception). Osteoporosis and menopause  Osteoporosis is a disease in which the bones lose minerals and strength with aging. This can result in serious bone fractures. Your risk for osteoporosis can be identified using a bone density scan.  If you are 82 years of age or older, or if you are at risk for osteoporosis and fractures, ask your health care provider if you should be screened.  Ask your health care provider whether you should take a calcium or vitamin D supplement to lower your risk for osteoporosis.  Menopause may have certain physical symptoms and risks.  Hormone replacement therapy may reduce some of these symptoms and risks. Talk to your health care provider about whether hormone replacement therapy is right for you. Follow these instructions at home:  Schedule regular health, dental, and eye exams.  Stay current with your immunizations.  Do not use any tobacco products including cigarettes, chewing tobacco, or electronic cigarettes.  If you are  pregnant, do not drink alcohol.  If you are breastfeeding, limit how much and how often you drink alcohol.  Limit alcohol intake to no more than 1 drink per day for nonpregnant women. One drink equals 12 ounces of beer, 5 ounces of wine, or 1 ounces of hard liquor.  Do not use street drugs.  Do not share needles.  Ask your health care provider for help if you need support or information about quitting drugs.  Tell your health care provider if you often feel depressed.  Tell your health care provider if you have ever been abused or do not feel safe at home. This information is not intended to replace advice given to you by your health care provider. Make sure you discuss any questions you have with your health care provider. Document Released: 01/16/2011 Document Revised: 12/09/2015 Document Reviewed: 04/06/2015 Elsevier Interactive Patient Education  [employer].

## 2018-05-13 NOTE — Progress Notes (Signed)
Patient ID: Amy Lara, female    DOB: Dec 14, 1975  Age: 42 y.o. MRN: [id number]  The patient is here for annual preventive examination and management of other chronic and acute problems.   The risk factors are reflected in the social history.  The roster of all physicians providing medical care to patient - is listed in the Snapshot section of the chart.  Activities of daily living:  The patient is 100% independent in all ADLs: dressing, toileting, feeding as well as independent mobility  Home safety : The patient has smoke detectors in the home. They wear seatbelts.  There are no firearms at home. There is no violence in the home.   There is no risks for hepatitis, STDs or HIV. There is no   history of blood transfusion. They have no travel history to infectious disease endemic areas of the world.  The patient has seen their dentist in the last six month. They have seen their eye doctor in the last year.  They do not  have excessive sun exposure. Discussed the need for sun protection: hats, long sleeves and use of sunscreen if there is significant sun exposure.   Diet: the importance of a healthy diet is discussed. She is following a low glycemic index diet .    The benefits of regular aerobic exercise were discussed. She is not exercising regularly.   Depression screen: there are no signs or vegative symptoms of depression- irritability, change in appetite, anhedonia, sadness/tearfullness.   The following portions of the patient's history were reviewed and updated as appropriate: allergies, current medications, past family history, past medical history,  past surgical history, past social history  and problem list.  Visual acuity was not assessed per patient preference since she has regular follow up with her ophthalmologist. Hearing and body mass index were assessed and reviewed.   During the course of the visit the patient was educated and counseled about appropriate screening and  preventive services including : fall prevention , diabetes screening, nutrition counseling, colorectal cancer screening, and recommended immunizations.    CC: The primary encounter diagnosis was Encounter for preventive health examination. Diagnoses of Breast cancer screening, Palpitations, Impaired fasting glucose, Hyperlipidemia with target LDL less than 160, Obesity (BMI 30.0-34.9), and Palpitations with regular cardiac rhythm were also pertinent to this visit.  1) recent episode of feeling tremulous for a period lasting over 12 hours.  Felt htat her pulse was elevated,  Did not improve with eating..  Mother recently diagnosed with atrial fib.   History Briteny has a past medical history of Acquired pes planus of both feet, Allergy, Asthma, Fibrocystic breast disease in female, GERD (gastroesophageal reflux disease), Hyperlipidemia, Kidney stones, Plantar fasciitis, bilateral, and Thalassemia carrier.   She has a past surgical history that includes Tubal ligation.   Her family history includes Atrial fibrillation (age of onset: 56) in her mother; Cancer in her father and paternal uncle; Heart disease in her paternal grandmother; Hyperlipidemia in her father, mother, paternal aunt, paternal grandfather, and paternal uncle.She reports that she has never smoked. She has never used smokeless tobacco. She reports that she drinks alcohol. She reports that she does not use drugs.  Outpatient Medications Prior to Visit  Medication Sig Dispense Refill  . ranitidine (ZANTAC) 150 MG tablet Take 150 mg by mouth 2 (two) times daily.    Marland Kitchen omeprazole (PRILOSEC) 40 MG capsule Take 1 capsule (40 mg total) by mouth daily. 30 capsule 3   No facility-administered medications prior to  visit.     Review of Systems   Patient denies headache, fevers, malaise, unintentional weight loss, skin rash, eye pain, sinus congestion and sinus pain, sore throat, dysphagia,  hemoptysis , cough, dyspnea, wheezing, chest pain,  palpitations, orthopnea, edema, abdominal pain, nausea, melena, diarrhea, constipation, flank pain, dysuria, hematuria, urinary  Frequency, nocturia, numbness, tingling, seizures,  Focal weakness, Loss of consciousness,  Tremor, insomnia, depression, anxiety, and suicidal ideation.     Objective:  BP 116/80 (BP Location: Left Arm, Patient Position: Sitting, Cuff Size: Large)   Pulse 96   Temp 98.7 F (37.1 C) (Oral)   Resp 15   Ht 5' 1.5" (1.562 m)   Wt 179 lb 12.8 oz (81.6 kg)   SpO2 99%   BMI 33.42 kg/m   Physical Exam  General appearance: alert, cooperative and appears stated age Head: Normocephalic, without obvious abnormality, atraumatic Eyes: conjunctivae/corneas clear. PERRL, EOM's intact. Fundi benign. Ears: normal TM's and external ear canals both ears Nose: Nares normal. Septum midline. Mucosa normal. No drainage or sinus tenderness. Throat: lips, mucosa, and tongue normal; teeth and gums normal Neck: no adenopathy, no carotid bruit, no JVD, supple, symmetrical, trachea midline and thyroid not enlarged, symmetric, no tenderness/mass/nodules Lungs: clear to auscultation bilaterally Breasts: normal appearance, no masses or tenderness Heart: regular rate and rhythm, S1, S2 normal, no murmur, click, rub or gallop Abdomen: soft, non-tender; bowel sounds normal; no masses,  no organomegaly Extremities: extremities normal, atraumatic, no cyanosis or edema Pulses: 2+ and symmetric Skin: Skin color, texture, turgor normal. No rashes or lesions Neurologic: Alert and oriented X 3, normal strength and tone. Normal symmetric reflexes. Normal coordination and gait.     Assessment & Plan:   Problem List Items Addressed This Visit    Breast cancer screening   Relevant Orders   MM 3D SCREEN BREAST BILATERAL   Encounter for preventive health examination - Primary    Annual comprehensive preventive exam was done as well as an evaluation and management of chronic conditions .  During  the course of the visit the patient was educated and counseled about appropriate screening and preventive services including :  diabetes screening, lipid analysis with projected  10 year  risk for CAD , nutrition counseling, breast, cervical and colorectal cancer screening, and recommended immunizations.  Printed recommendations for health maintenance screenings was given      Hyperlipidemia with target LDL less than 160    Mild, with no other cardiac risk factors .  No treatment indicated.  Lab Results  Component Value Date   CHOL 224 (H) 05/13/2018   HDL 54.10 05/13/2018   LDLCALC 136 (H) 05/13/2018   LDLDIRECT 112.5 12/30/2012   TRIG 167.0 (H) 05/13/2018   CHOLHDL 4 05/13/2018         Obesity (BMI 30.0-34.9)    I have congratulated her in  Adherence to a low GI diet and  encouraged her to reduce her portion size and engage in  regular exercise a minimum of 5 days per week.   Lab Results  Component Value Date   TSH 1.11 05/13/2018   Lab Results  Component Value Date   HGBA1C 5.7 (H) 05/13/2018         Palpitations with regular cardiac rhythm    Heart exam is normal and screening for thyroid normal.  Lab Results  Component Value Date   TSH 1.11 05/13/2018   .        Other Visit Diagnoses    Palpitations  Relevant Orders   TSH (Completed)   Impaired fasting glucose       Relevant Orders   Lipid panel (Completed)   Comprehensive metabolic panel (Completed)   Hemoglobin A1c      I have discontinued Shaunta Dimare's omeprazole. I am also having her maintain her ranitidine.  No orders of the defined types were placed in this encounter.   Medications Discontinued During This Encounter  Medication Reason  . omeprazole (PRILOSEC) 40 MG capsule     Follow-up: No follow-ups on file.   Sherlene Shams, MD

## 2018-05-14 ENCOUNTER — Encounter: Payer: Self-pay | Admitting: Internal Medicine

## 2018-05-14 DIAGNOSIS — E66811 Obesity, class 1: Secondary | ICD-10-CM | POA: Insufficient documentation

## 2018-05-14 DIAGNOSIS — R002 Palpitations: Secondary | ICD-10-CM | POA: Insufficient documentation

## 2018-05-14 DIAGNOSIS — E669 Obesity, unspecified: Secondary | ICD-10-CM | POA: Insufficient documentation

## 2018-05-14 LAB — HEMOGLOBIN A1C
EAG (MMOL/L): 6.5 (calc)
Hgb A1c MFr Bld: 5.7 % of total Hgb — ABNORMAL HIGH (ref <5.7)
MEAN PLASMA GLUCOSE: 117 (calc)

## 2018-05-14 NOTE — Assessment & Plan Note (Addendum)
I have congratulated her in  Adherence to a low GI diet and  encouraged her to reduce her portion size and engage in  regular exercise a minimum of 5 days per week.   Lab Results  Component Value Date   TSH 1.11 05/13/2018   Lab Results  Component Value Date   HGBA1C 5.7 (H) 05/13/2018

## 2018-05-14 NOTE — Assessment & Plan Note (Signed)
Annual comprehensive preventive exam was done as well as an evaluation and management of chronic conditions .  During the course of the visit the patient was educated and counseled about appropriate screening and preventive services including :  diabetes screening, lipid analysis with projected  10 year  risk for CAD , nutrition counseling, breast, cervical and colorectal cancer screening, and recommended immunizations.  Printed recommendations for health maintenance screenings was given 

## 2018-05-14 NOTE — Assessment & Plan Note (Signed)
Heart exam is normal and screening for thyroid normal.  Lab Results  Component Value Date   TSH 1.11 05/13/2018   .

## 2018-05-14 NOTE — Assessment & Plan Note (Signed)
Mild, with no other cardiac risk factors .  No treatment indicated.  Lab Results  Component Value Date   CHOL 224 (H) 05/13/2018   HDL 54.10 05/13/2018   LDLCALC 136 (H) 05/13/2018   LDLDIRECT 112.5 12/30/2012   TRIG 167.0 (H) 05/13/2018   CHOLHDL 4 05/13/2018

## 2018-06-17 ENCOUNTER — Inpatient Hospital Stay: Admission: RE | Admit: 2018-06-17 | Payer: 59 | Source: Ambulatory Visit

## 2018-06-26 ENCOUNTER — Other Ambulatory Visit: Payer: Self-pay | Admitting: Internal Medicine

## 2018-06-26 ENCOUNTER — Ambulatory Visit
Admission: RE | Admit: 2018-06-26 | Discharge: 2018-06-26 | Disposition: A | Discharge status: Home / Self Care | Payer: 59 | Source: Ambulatory Visit | Attending: Internal Medicine | Admitting: Internal Medicine

## 2018-06-26 DIAGNOSIS — Z1231 Encounter for screening mammogram for malignant neoplasm of breast: Secondary | ICD-10-CM | POA: Diagnosis not present

## 2018-06-26 DIAGNOSIS — Z1239 Encounter for other screening for malignant neoplasm of breast: Secondary | ICD-10-CM | POA: Diagnosis present

## 2018-06-26 DIAGNOSIS — R928 Other abnormal and inconclusive findings on diagnostic imaging of breast: Secondary | ICD-10-CM

## 2018-06-30 ENCOUNTER — Telehealth: Payer: Self-pay | Admitting: Internal Medicine

## 2018-07-15 ENCOUNTER — Ambulatory Visit
Admission: RE | Admit: 2018-07-15 | Discharge: 2018-07-15 | Disposition: A | Discharge status: Home / Self Care | Payer: 59 | Source: Ambulatory Visit | Attending: Internal Medicine | Admitting: Internal Medicine

## 2018-07-15 DIAGNOSIS — R928 Other abnormal and inconclusive findings on diagnostic imaging of breast: Secondary | ICD-10-CM | POA: Diagnosis present

## 2018-07-15 DIAGNOSIS — R922 Inconclusive mammogram: Secondary | ICD-10-CM | POA: Diagnosis not present

## 2019-04-23 DIAGNOSIS — Z0000 Encounter for general adult medical examination without abnormal findings: Secondary | ICD-10-CM

## 2019-04-23 DIAGNOSIS — E785 Hyperlipidemia, unspecified: Secondary | ICD-10-CM

## 2019-04-24 NOTE — Telephone Encounter (Signed)
Pt would like to have labs done prior to physical on 05/16/2019. I have ordered A1c, CMP, CBC, TSH and lipid panel. Is there anything else that needs to be ordered?

## 2019-05-12 ENCOUNTER — Other Ambulatory Visit: Payer: Self-pay

## 2019-05-12 ENCOUNTER — Other Ambulatory Visit (INDEPENDENT_AMBULATORY_CARE_PROVIDER_SITE_OTHER): Payer: 59

## 2019-05-12 ENCOUNTER — Other Ambulatory Visit: Payer: 59

## 2019-05-12 DIAGNOSIS — Z0000 Encounter for general adult medical examination without abnormal findings: Secondary | ICD-10-CM

## 2019-05-12 DIAGNOSIS — E785 Hyperlipidemia, unspecified: Secondary | ICD-10-CM

## 2019-05-12 DIAGNOSIS — R7309 Other abnormal glucose: Secondary | ICD-10-CM

## 2019-05-12 LAB — COMPREHENSIVE METABOLIC PANEL
ALT: 17 U/L (ref 0–35)
AST: 16 U/L (ref 0–37)
Albumin: 4.3 g/dL (ref 3.5–5.2)
Alkaline Phosphatase: 72 U/L (ref 39–117)
BUN: 10 mg/dL (ref 6–23)
CO2: 26 mEq/L (ref 19–32)
Calcium: 9.3 mg/dL (ref 8.4–10.5)
Chloride: 103 mEq/L (ref 96–112)
Creatinine, Ser: 0.78 mg/dL (ref 0.40–1.20)
GFR: 80.38 mL/min (ref >60.00)
Glucose, Bld: 93 mg/dL (ref 70–99)
Potassium: 4.1 mEq/L (ref 3.5–5.1)
Sodium: 139 mEq/L (ref 135–145)
Total Bilirubin: 0.4 mg/dL (ref 0.2–1.2)
Total Protein: 7.7 g/dL (ref 6.0–8.3)

## 2019-05-12 LAB — CBC WITH DIFFERENTIAL/PLATELET
Basophils Absolute: 0 10*3/uL (ref 0.0–0.1)
Basophils Relative: 0.6 % (ref 0.0–3.0)
Eosinophils Absolute: 0.1 10*3/uL (ref 0.0–0.7)
Eosinophils Relative: 1 % (ref 0.0–5.0)
HCT: 40.2 % (ref 36.0–46.0)
Hemoglobin: 12.7 g/dL (ref 12.0–15.0)
Lymphocytes Relative: 28 % (ref 12.0–46.0)
Lymphs Abs: 2.4 10*3/uL (ref 0.7–4.0)
MCHC: 31.6 g/dL (ref 30.0–36.0)
MCV: 70.2 fl — ABNORMAL LOW (ref 78.0–100.0)
Monocytes Absolute: 0.6 10*3/uL (ref 0.1–1.0)
Monocytes Relative: 6.6 % (ref 3.0–12.0)
Neutro Abs: 5.5 10*3/uL (ref 1.4–7.7)
Neutrophils Relative %: 63.8 % (ref 43.0–77.0)
Platelets: 328 10*3/uL (ref 150.0–400.0)
RBC: 5.73 Mil/uL — ABNORMAL HIGH (ref 3.87–5.11)
RDW: 15.9 % — ABNORMAL HIGH (ref 11.5–15.5)
WBC: 8.6 10*3/uL (ref 4.0–10.5)

## 2019-05-12 LAB — LIPID PANEL
Cholesterol: 206 mg/dL — ABNORMAL HIGH (ref 0–200)
HDL: 53.5 mg/dL (ref >39.00)
LDL Cholesterol: 124 mg/dL — ABNORMAL HIGH (ref 0–99)
NonHDL: 152.59
Total CHOL/HDL Ratio: 4
Triglycerides: 145 mg/dL (ref 0.0–149.0)
VLDL: 29 mg/dL (ref 0.0–40.0)

## 2019-05-12 LAB — TSH: TSH: 2 u[IU]/mL (ref 0.35–4.50)

## 2019-05-13 LAB — HEMOGLOBIN A1C
Hgb A1c MFr Bld: 5.7 % of total Hgb — ABNORMAL HIGH (ref <5.7)
Mean Plasma Glucose: 117 (calc)
eAG (mmol/L): 6.5 (calc)

## 2019-05-15 ENCOUNTER — Other Ambulatory Visit: Payer: Self-pay

## 2019-05-16 ENCOUNTER — Other Ambulatory Visit (HOSPITAL_COMMUNITY)
Admission: RE | Admit: 2019-05-16 | Discharge: 2019-05-16 | Disposition: A | Discharge status: Home / Self Care | Payer: 59 | Source: Ambulatory Visit | Attending: Internal Medicine | Admitting: Internal Medicine

## 2019-05-16 ENCOUNTER — Encounter: Payer: Self-pay | Admitting: Internal Medicine

## 2019-05-16 ENCOUNTER — Ambulatory Visit (INDEPENDENT_AMBULATORY_CARE_PROVIDER_SITE_OTHER): Payer: 59 | Admitting: Internal Medicine

## 2019-05-16 ENCOUNTER — Encounter: Payer: 59 | Admitting: Internal Medicine

## 2019-05-16 VITALS — BP 122/88 | HR 96 | Temp 97.7°F | Ht 60.95 in | Wt 183.0 lb

## 2019-05-16 DIAGNOSIS — Z23 Encounter for immunization: Secondary | ICD-10-CM | POA: Diagnosis not present

## 2019-05-16 DIAGNOSIS — E669 Obesity, unspecified: Secondary | ICD-10-CM | POA: Diagnosis not present

## 2019-05-16 DIAGNOSIS — R030 Elevated blood-pressure reading, without diagnosis of hypertension: Secondary | ICD-10-CM

## 2019-05-16 DIAGNOSIS — Z0000 Encounter for general adult medical examination without abnormal findings: Secondary | ICD-10-CM

## 2019-05-16 DIAGNOSIS — I10 Essential (primary) hypertension: Secondary | ICD-10-CM | POA: Insufficient documentation

## 2019-05-16 DIAGNOSIS — Z124 Encounter for screening for malignant neoplasm of cervix: Secondary | ICD-10-CM | POA: Diagnosis not present

## 2019-05-16 DIAGNOSIS — E66811 Obesity, class 1: Secondary | ICD-10-CM

## 2019-05-16 NOTE — Patient Instructions (Addendum)
The new goals for optimal blood pressure management are 120/70.  Please check your blood pressure a few times at home and send me the readings so I can determine if you need to start  medication   If your urine test today is positive for protein,  I will recommend starting a medication for hypertension     We should repeat your labs in 6 months and we can give you your tetanus vaccine then .     Health Maintenance, Female Adopting a healthy lifestyle and getting preventive care are important in promoting health and wellness. Ask your health care provider about:  The right schedule for you to have regular tests and exams.  Things you can do on your own to prevent diseases and keep yourself healthy. What should I know about diet, weight, and exercise? Eat a healthy diet   Eat a diet that includes plenty of vegetables, fruits, low-fat dairy products, and lean protein.  Do not eat a lot of foods that are high in solid fats, added sugars, or sodium. Maintain a healthy weight Body mass index (BMI) is used to identify weight problems. It estimates body fat based on height and weight. Your health care provider can help determine your BMI and help you achieve or maintain a healthy weight. Get regular exercise Get regular exercise. This is one of the most important things you can do for your health. Most adults should:  Exercise for at least 150 minutes each week. The exercise should increase your heart rate and make you sweat (moderate-intensity exercise).  Do strengthening exercises at least twice a week. This is in addition to the moderate-intensity exercise.  Spend less time sitting. Even light physical activity can be beneficial. Watch cholesterol and blood lipids Have your blood tested for lipids and cholesterol at 43 years of age, then have this test every 5 years. Have your cholesterol levels checked more often if:  Your lipid or cholesterol levels are high.  You are older than  43 years of age.  You are at high risk for heart disease. What should I know about cancer screening? Depending on your health history and family history, you may need to have cancer screening at various ages. This may include screening for:  Breast cancer.  Cervical cancer.  Colorectal cancer.  Skin cancer.  Lung cancer. What should I know about heart disease, diabetes, and high blood pressure? Blood pressure and heart disease  High blood pressure causes heart disease and increases the risk of stroke. This is more likely to develop in people who have high blood pressure readings, are of African descent, or are overweight.  Have your blood pressure checked: ? Every 3-5 years if you are 56-53 years of age. ? Every year if you are 58 years old or older. Diabetes Have regular diabetes screenings. This checks your fasting blood sugar level. Have the screening done:  Once every three years after age 57 if you are at a normal weight and have a low risk for diabetes.  More often and at a younger age if you are overweight or have a high risk for diabetes. What should I know about preventing infection? Hepatitis B If you have a higher risk for hepatitis B, you should be screened for this virus. Talk with your health care provider to find out if you are at risk for hepatitis B infection. Hepatitis C Testing is recommended for:  Everyone born from [birth year] through 1965.  Anyone with known risk factors  for hepatitis C. Sexually transmitted infections (STIs)  Get screened for STIs, including gonorrhea and chlamydia, if: ? You are sexually active and are younger than 43 years of age. ? You are older than 43 years of age and your health care provider tells you that you are at risk for this type of infection. ? Your sexual activity has changed since you were last screened, and you are at increased risk for chlamydia or gonorrhea. Ask your health care provider if you are at risk.  Ask your  health care provider about whether you are at high risk for HIV. Your health care provider may recommend a prescription medicine to help prevent HIV infection. If you choose to take medicine to prevent HIV, you should first get tested for HIV. You should then be tested every 3 months for as long as you are taking the medicine. Pregnancy  If you are about to stop having your period (premenopausal) and you may become pregnant, seek counseling before you get pregnant.  Take 400 to 800 micrograms (mcg) of folic acid every day if you become pregnant.  Ask for birth control (contraception) if you want to prevent pregnancy. Osteoporosis and menopause Osteoporosis is a disease in which the bones lose minerals and strength with aging. This can result in bone fractures. If you are 69 years old or older, or if you are at risk for osteoporosis and fractures, ask your health care provider if you should:  Be screened for bone loss.  Take a calcium or vitamin D supplement to lower your risk of fractures.  Be given hormone replacement therapy (HRT) to treat symptoms of menopause. Follow these instructions at home: Lifestyle  Do not use any products that contain nicotine or tobacco, such as cigarettes, e-cigarettes, and chewing tobacco. If you need help quitting, ask your health care provider.  Do not use street drugs.  Do not share needles.  Ask your health care provider for help if you need support or information about quitting drugs. Alcohol use  Do not drink alcohol if: ? Your health care provider tells you not to drink. ? You are pregnant, may be pregnant, or are planning to become pregnant.  If you drink alcohol: ? Limit how much you use to 0-1 drink a day. ? Limit intake if you are breastfeeding.  Be aware of how much alcohol is in your drink. In the [place] one drink equals one 12 oz bottle of beer (355 mL), one 5 oz glass of wine (148 mL), or one 1 oz glass of hard liquor (44 mL). General  instructions  Schedule regular health, dental, and eye exams.  Stay current with your vaccines.  Tell your health care provider if: ? You often feel depressed. ? You have ever been abused or do not feel safe at home. Summary  Adopting a healthy lifestyle and getting preventive care are important in promoting health and wellness.  Follow your health care provider's instructions about healthy diet, exercising, and getting tested or screened for diseases.  Follow your health care provider's instructions on monitoring your cholesterol and blood pressure. This information is not intended to replace advice given to you by your health care provider. Make sure you discuss any questions you have with your health care provider. Document Released: 01/16/2011 Document Revised: 06/26/2018 Document Reviewed: 06/26/2018 Elsevier Patient Education  2020 [employer].

## 2019-05-16 NOTE — Assessment & Plan Note (Addendum)
The new goals for optimal blood pressure management are 120/70.  advised patient to  check your blood pressure a few times at home and send me the readings so I can determine if she needs a change in medication   Lab Results  Component Value Date   CREATININE 0.78 05/12/2019   Lab Results  Component Value Date   NA 139 05/12/2019   K 4.1 05/12/2019   CL 103 05/12/2019   CO2 26 05/12/2019

## 2019-05-16 NOTE — Progress Notes (Signed)
Patient ID: Amy Lara, female    DOB: 10-24-1975  Age: 43 y.o. MRN: [id number]  The patient is here for annual examination and management of other chronic and acute problems.  Due for PAP smear Mammogram was  due in Dec 2019 Due for  tetanus,  HIV and flu  The risk factors are reflected in the social history.  The roster of all physicians providing medical care to patient - is listed in the Snapshot section of the chart.  Activities of daily living:  The patient is 100% independent in all ADLs: dressing, toileting, feeding as well as independent mobility  Home safety : The patient has smoke detectors in the home. They wear seatbelts.  There are no firearms at home. There is no violence in the home.   There is no risks for hepatitis, STDs or HIV. There is no   history of blood transfusion. They have no travel history to infectious disease endemic areas of the world.  The patient has seen their dentist in the last six month. They have seen their eye doctor in the last year. They admit to slight hearing difficulty with regard to whispered voices and some television programs.  They have deferred audiologic testing in the last year.  They do not  have excessive sun exposure. Discussed the need for sun protection: hats, long sleeves and use of sunscreen if there is significant sun exposure.   Diet: the importance of a healthy diet is discussed. They do have a healthy diet.  The benefits of regular aerobic exercise were discussed. She walks 4 times per week ,  20 minutes.   Depression screen: there are no signs or vegative symptoms of depression- irritability, change in appetite, anhedonia, sadness/tearfullness.  Cognitive assessment: the patient manages all their financial and personal affairs and is actively engaged. They could relate day,date,year and events; recalled 2/3 objects at 3 minutes; performed clock-face test normally.  The following portions of the patient's history were reviewed  and updated as appropriate: allergies, current medications, past family history, past medical history,  past surgical history, past social history  and problem list.  Visual acuity was not assessed per patient preference since she has regular follow up with her ophthalmologist. Hearing and body mass index were assessed and reviewed.   During the course of the visit the patient was educated and counseled about appropriate screening and preventive services including : fall prevention , diabetes screening, nutrition counseling, colorectal cancer screening, and recommended immunizations.    CC: The primary encounter diagnosis was Cervical cancer screening. Diagnoses of Need for immunization against influenza, Elevated blood-pressure reading, without diagnosis of hypertension, Elevated blood pressure reading without diagnosis of hypertension, Encounter for preventive health examination, and Obesity (BMI 30.0-34.9) were also pertinent to this visit.  Menstrual irregularity,  Started this year with 2 menses from July to  October. Some night sweats    Has had Tubal ligation   Pre prediabetes.  Not exercising due to schedule.    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 Limiting starch in diet   History Amy Lara has a past medical history of Acquired pes planus of both feet, Allergy, Asthma, Fibrocystic breast disease in female, GERD (gastroesophageal reflux disease), Hyperlipidemia, Kidney stones, Plantar fasciitis, bilateral, and Thalassemia carrier.   She has a past surgical history that includes Tubal ligation.   Her family history includes Atrial fibrillation (age of onset: 88) in her mother; Cancer in her  father and paternal uncle; Heart disease in her paternal grandmother; Hyperlipidemia in her father, mother, paternal aunt, paternal grandfather, and paternal uncle.She reports that she has never smoked. She has never used smokeless tobacco. She reports current alcohol use.  She reports that she does not use drugs.  Outpatient Medications Prior to Visit  Medication Sig Dispense Refill  . Biotin 10 MG TABS Take 1 tablet by mouth.    . famotidine (PEPCID) 10 MG tablet Take 10 mg by mouth 2 (two) times daily.    Marland Kitchen. loratadine (CLARITIN) 10 MG tablet Take 10 mg by mouth daily.    . magnesium 30 MG tablet Take 30 mg by mouth 2 (two) times daily.    . Melatonin 10 MG TABS Take 1 tablet by mouth daily as needed.    . ranitidine (ZANTAC) 150 MG tablet Take 150 mg by mouth 2 (two) times daily.     No facility-administered medications prior to visit.      Patient denies headache, fevers, malaise, unintentional weight loss, skin rash, eye pain, sinus congestion and sinus pain, sore throat, dysphagia,  hemoptysis , cough, dyspnea, wheezing, chest pain, palpitations, orthopnea, edema, abdominal pain, nausea, melena, diarrhea, constipation, flank pain, dysuria, hematuria, urinary  Frequency, nocturia, numbness, tingling, seizures,  Focal weakness, Loss of consciousness,  Tremor, insomnia, depression, anxiety, and suicidal ideation.     Review of Systems  Objective:  BP 122/88   Pulse 96   Temp 97.7 F (36.5 C) (Temporal)   Ht 5' 0.95" (1.548 m)   Wt 183 lb (83 kg)   SpO2 99%   BMI 34.63 kg/m   Physical Exam  General Appearance:    Alert, cooperative, no distress, appears stated age  Head:    Normocephalic, without obvious abnormality, atraumatic  Eyes:    PERRL, conjunctiva/corneas clear, EOM's intact, fundi    benign, both eyes  Ears:    Normal TM's and external ear canals, both ears  Nose:   Nares normal, septum midline, mucosa normal, no drainage    or sinus tenderness  Throat:   Lips, mucosa, and tongue normal; teeth and gums normal  Neck:   Supple, symmetrical, trachea midline, no adenopathy;    thyroid:  no enlargement/tenderness/nodules; no carotid   bruit or JVD  Back:     Symmetric, no curvature, ROM normal, no CVA tenderness  Lungs:     Clear to  auscultation bilaterally, respirations unlabored  Chest Wall:    No tenderness or deformity   Heart:    Regular rate and rhythm, S1 and S2 normal, no murmur, rub   or gallop  Breast Exam:    No tenderness, masses, or nipple abnormality  Abdomen:     Soft, non-tender, bowel sounds active all four quadrants,    no masses, no organomegaly  Genitalia:    Pelvic: cervix normal in appearance, external genitalia normal, no adnexal masses or tenderness, no cervical motion tenderness, rectovaginal septum normal, uterus normal size, shape, and consistency and vagina normal without discharge  Extremities:   Extremities normal, atraumatic, no cyanosis or edema  Pulses:   2+ and symmetric all extremities  Skin:   Skin color, texture, turgor normal, no rashes or lesions  Lymph nodes:   Cervical, supraclavicular, and axillary nodes normal  Neurologic:   CNII-XII intact, normal strength, sensation and reflexes    throughout    Assessment & Plan:   Problem List Items Addressed This Visit      Unprioritized   Encounter for  preventive health examination    age appropriate education and counseling updated, referrals for preventative services and immunizations addressed, dietary and smoking counseling addressed, most recent labs reviewed.  I have personally reviewed and have noted:  1) the patient's medical and social history 2) The pt's use of alcohol, tobacco, and illicit drugs 3) The patient's current medications and supplements 4) Functional ability including ADL's, fall risk, home safety risk, hearing and visual impairment 5) Diet and physical activities 6) Evidence for depression or mood disorder 7) The patient's height, weight, and BMI have been recorded in the chart  I have made referrals, and provided counseling and education based on review of the above      Obesity (BMI 30.0-34.9)    I have congratulated her in  Adherence to a low GI diet and  encouraged her to reduce her portion size and  engage in  regular exercise a minimum of 5 days per week.   Lab Results  Component Value Date   TSH 2.00 05/12/2019   Lab Results  Component Value Date   HGBA1C 5.7 (H) 05/12/2019         Elevated blood pressure reading without diagnosis of hypertension    The new goals for optimal blood pressure management are 120/70.  advised patient to  check your blood pressure a few times at home and send me the readings so I can determine if she needs a change in medication   Lab Results  Component Value Date   CREATININE 0.78 05/12/2019   Lab Results  Component Value Date   NA 139 05/12/2019   K 4.1 05/12/2019   CL 103 05/12/2019   CO2 26 05/12/2019          Other Visit Diagnoses    Cervical cancer screening    -  Primary   Relevant Orders   Cytology - PAP( [facility])   Need for immunization against influenza       Relevant Orders   Flu Vaccine QUAD 36+ mos IM (Completed)   Elevated blood-pressure reading, without diagnosis of hypertension       Relevant Orders   Microalbumin / creatinine urine ratio (Completed)      I have discontinued December Grzywacz's ranitidine. I am also having her maintain her famotidine, Melatonin, Biotin, loratadine, and magnesium.  No orders of the defined types were placed in this encounter.   Medications Discontinued During This Encounter  Medication Reason  . ranitidine (ZANTAC) 150 MG tablet Change in therapy    Follow-up: Return in about 6 months (around 11/14/2019).   Crecencio Mc, MD

## 2019-05-17 LAB — MICROALBUMIN / CREATININE URINE RATIO
Creatinine, Urine: 88.8 mg/dL
Microalb/Creat Ratio: <3 mg/g creat (ref 0–29)
Microalbumin, Urine: <3 ug/mL

## 2019-05-18 NOTE — Assessment & Plan Note (Signed)

## 2019-05-18 NOTE — Assessment & Plan Note (Signed)
I have congratulated her in  Adherence to a low GI diet and  encouraged her to reduce her portion size and engage in  regular exercise a minimum of 5 days per week.   Lab Results  Component Value Date   TSH 2.00 05/12/2019   Lab Results  Component Value Date   HGBA1C 5.7 (H) 05/12/2019

## 2019-05-21 LAB — CYTOLOGY - PAP
Adequacy: ABSENT
Comment: NEGATIVE
Diagnosis: NEGATIVE
High risk HPV: POSITIVE — AB

## 2019-05-23 NOTE — Telephone Encounter (Signed)
Called and spoke to patient concerning MyChart message that was sent yesterday.  Patient said that she only has one or two on hives left on her neck today.  Patient said that she took a claritin this morning and that the hives are clearing up.  Patient said that she had the flu shot last week and wasn't sure if the hives was a reaction to the flu shot.  Advised patient to call if hives return but to go to urgent care or ER if her throat swells or has difficulty breathing.  Please advise.

## 2019-10-20 ENCOUNTER — Other Ambulatory Visit: Payer: Self-pay

## 2019-10-20 ENCOUNTER — Ambulatory Visit: Payer: 59 | Admitting: Internal Medicine

## 2019-10-20 ENCOUNTER — Encounter: Payer: Self-pay | Admitting: Internal Medicine

## 2019-10-20 VITALS — BP 124/86 | HR 110 | Temp 98.2°F | Ht 60.95 in | Wt 187.6 lb

## 2019-10-20 DIAGNOSIS — E785 Hyperlipidemia, unspecified: Secondary | ICD-10-CM

## 2019-10-20 DIAGNOSIS — E669 Obesity, unspecified: Secondary | ICD-10-CM

## 2019-10-20 DIAGNOSIS — R000 Tachycardia, unspecified: Secondary | ICD-10-CM

## 2019-10-20 DIAGNOSIS — Z111 Encounter for screening for respiratory tuberculosis: Secondary | ICD-10-CM | POA: Diagnosis not present

## 2019-10-20 DIAGNOSIS — R030 Elevated blood-pressure reading, without diagnosis of hypertension: Secondary | ICD-10-CM

## 2019-10-20 DIAGNOSIS — R002 Palpitations: Secondary | ICD-10-CM

## 2019-10-20 DIAGNOSIS — R0789 Other chest pain: Secondary | ICD-10-CM | POA: Diagnosis not present

## 2019-10-20 DIAGNOSIS — Z23 Encounter for immunization: Secondary | ICD-10-CM

## 2019-10-20 MED ORDER — OMEPRAZOLE 20 MG PO CPDR: 20.0000 mg | DELAYED_RELEASE_CAPSULE | Freq: Every day | ORAL | 3 refills | Status: DC

## 2019-10-20 MED ORDER — METOPROLOL SUCCINATE ER 25 MG PO TB24: 25.0000 mg | ORAL_TABLET | Freq: Every day | ORAL | 3 refills | Status: DC

## 2019-10-20 NOTE — Patient Instructions (Signed)
  I am upgrading your treatment of reflux to omeprazole  To see if the chest tightness you are having after eating is caused by esophageal spasm due to GERD .  Take it in the morning ,  And take the famotidine in the evening around dinnertime   I am starting you on Toprol XL to control your blood pressure and heart rate

## 2019-10-20 NOTE — Progress Notes (Signed)
Subjective:  Patient ID: Amy Lara, female    DOB: 1975-09-17  Age: 44 y.o. MRN: [id number]  CC: The primary encounter diagnosis was Need for measles-mumps-rubella (MMR) vaccine. Diagnoses of Screening-pulmonary TB, Need for chickenpox vaccination, Elevated blood pressure reading without diagnosis of hypertension, Tachycardia, Need for Tdap vaccination, Feeling of chest tightness, Hyperlipidemia with target LDL less than 160, Obesity (BMI 30.0-34.9), and Palpitations with regular cardiac rhythm were also pertinent to this visit.  HPI Amy Lara presents for follow up on elevated blood pressure  This visit occurred during the SARS-CoV-2 public health emergency.  Safety protocols were in place, including screening questions prior to the visit, additional usage of staff PPE, and extensive cleaning of exam room while observing appropriate contact time as indicated for disinfecting solutions.   Amy Lara is a 44 yr old female with no significant PMH who presents for  Follow up on elevated blood pressure,  Obesity , palpitations,  And new onset chest tightness.   She has had her BP checked multiple times in the last month with readings both above and at goal.  She continues to have occasional palpitations, but they are brief,  Infrequent , and do not cause dizziness or presyncope.     she has a history of mild hyperlipidemia,  Obesity and thalassemia carrier.  She is now experiencing midsternal chest tightness that occurs after eating , not with exercise. She feels it may be related to her GERD,  But she was exposed to COVID infection from husband in early march,  But tested negative x 2 (required for her return to work).   She has been walking a mile daily for the last 3 weeks and denies chest pain or tightness with exertion, but does not that while her resting heart rate Is usually in the 80's ,  it accelerates to > 100 just  walking to bathroom.  The mile walk is done in about 20 minutes and she does  note shortness of breath only if she is talking to her companion during the walk.   She has symptoms of GERD managed with famotidine,  Sleeps on 2 pillows for management of GERD. Denies fluid retention, weight gain and  orthopnea   Tested negative for COVID on March 2 and 6 (husband was ill).    Outpatient Medications Prior to Visit  Medication Sig Dispense Refill  . aspirin EC 81 MG tablet Take 81 mg by mouth daily.    . Biotin 10 MG TABS Take 1 tablet by mouth.    . diphenhydrAMINE HCl (BENADRYL PO) Take by mouth.    . famotidine (PEPCID) 10 MG tablet Take 10 mg by mouth 2 (two) times daily.    . magnesium 30 MG tablet Take 30 mg by mouth 2 (two) times daily.    Marland Kitchen loratadine (CLARITIN) 10 MG tablet Take 10 mg by mouth daily.    . Melatonin 10 MG TABS Take 1 tablet by mouth daily as needed.     No facility-administered medications prior to visit.    Review of Systems;  Patient denies headache, fevers, malaise, unintentional weight loss, skin rash, eye pain, sinus congestion and sinus pain, sore throat, dysphagia,  hemoptysis , cough, dyspnea, wheezing, chest pain, palpitations, orthopnea, edema, abdominal pain, nausea, melena, diarrhea, constipation, flank pain, dysuria, hematuria, urinary  Frequency, nocturia, numbness, tingling, seizures,  Focal weakness, Loss of consciousness,  Tremor, insomnia, depression, anxiety, and suicidal ideation.      Objective:  BP 124/86   Pulse Marland Kitchen)  110   Temp 98.2 F (36.8 C) (Temporal)   Ht 5' 0.95" (1.548 m)   Wt 187 lb 9.6 oz (85.1 kg)   SpO2 98%   BMI 35.50 kg/m   BP Readings from Last 3 Encounters:  10/20/19 124/86  05/16/19 122/88  05/13/18 116/80    Wt Readings from Last 3 Encounters:  10/20/19 187 lb 9.6 oz (85.1 kg)  05/16/19 183 lb (83 kg)  05/13/18 179 lb 12.8 oz (81.6 kg)    General appearance: alert, cooperative and appears stated age Ears: normal TM's and external ear canals both ears Throat: lips, mucosa, and tongue  normal; teeth and gums normal Neck: no adenopathy, no carotid bruit, supple, symmetrical, trachea midline and thyroid not enlarged, symmetric, no tenderness/mass/nodules Back: symmetric, no curvature. ROM normal. No CVA tenderness. Lungs: clear to auscultation bilaterally Heart: regular rate and rhythm, S1, S2 normal, no murmur, click, rub or gallop Abdomen: soft, non-tender; bowel sounds normal; no masses,  no organomegaly Pulses: 2+ and symmetric Skin: Skin color, texture, turgor normal. No rashes or lesions Lymph nodes: Cervical, supraclavicular, and axillary nodes normal.  Lab Results  Component Value Date   HGBA1C 5.7 (H) 05/12/2019   HGBA1C 5.7 (H) 05/13/2018    Lab Results  Component Value Date   CREATININE 0.78 05/12/2019   CREATININE 0.73 05/13/2018   CREATININE 0.72 01/14/2016    Lab Results  Component Value Date   WBC 8.6 05/12/2019   HGB 12.7 05/12/2019   HCT 40.2 05/12/2019   PLT 328.0 05/12/2019   GLUCOSE 93 05/12/2019   CHOL 206 (H) 05/12/2019   TRIG 145.0 05/12/2019   HDL 53.50 05/12/2019   LDLDIRECT 112.5 12/30/2012   LDLCALC 124 (H) 05/12/2019   ALT 17 05/12/2019   AST 16 05/12/2019   NA 139 05/12/2019   K 4.1 05/12/2019   CL 103 05/12/2019   CREATININE 0.78 05/12/2019   BUN 10 05/12/2019   CO2 26 05/12/2019   TSH 2.00 05/12/2019   HGBA1C 5.7 (H) 05/12/2019    No results found.  Assessment & Plan:   Problem List Items Addressed This Visit      Unprioritized   Hyperlipidemia with target LDL less than 160    Mild, with LDL < 130  By last check.  Repeating today given new onset hypertension,  Lab Results  Component Value Date   CHOL 206 (H) 05/12/2019   HDL 53.50 05/12/2019   LDLCALC 124 (H) 05/12/2019   LDLDIRECT 112.5 12/30/2012   TRIG 145.0 05/12/2019   CHOLHDL 4 05/12/2019         Relevant Medications   aspirin EC 81 MG tablet   metoprolol succinate (TOPROL-XL) 25 MG 24 hr tablet   Obesity (BMI 30.0-34.9)    She has gained  weight since her last follow up in November.  Given her a1c of 5.7 I have recommended that she adhere to a  low GI diet and continue participation in  regular exercise a minimum of 5 days per week.   Lab Results  Component Value Date   TSH 2.00 05/12/2019   Lab Results  Component Value Date   HGBA1C 5.7 (H) 05/12/2019         Palpitations with regular cardiac rhythm    Heart exam is normal and screening for thyroid normal.  I have ordered and reviewed a 12 lead EKG and find that there are no acute changes and patient is in sinus rhythm.    Lab Results  Component Value  Date   TSH 2.00 05/12/2019   .       Elevated blood pressure reading without diagnosis of hypertension   Relevant Orders   EKG 12-Lead   Microalbumin / creatinine urine ratio   DG Chest 2 View   Feeling of chest tightness    Her exam is normal,  But given her recent COVID EXPOSURE (with negative testing x 2)  Will obtain chest x ray to rule out scarring and signs of hyperexpansion.  Will also upgrade her GERD medication to omeprazole given the episodes are most noted during eating and may be due to esophageal spasm       Relevant Orders   DG Chest 2 View    Other Visit Diagnoses    Need for measles-mumps-rubella (MMR) vaccine    -  Primary   Relevant Orders   Measles/Mumps/Rubella Immunity   Screening-pulmonary TB       Relevant Orders   QuantiFERON-TB Gold Plus   Need for chickenpox vaccination       Relevant Orders   Varicella Zoster Abs, IgG/IgM (Completed)   Tachycardia       Relevant Orders   EKG 12-Lead   Comprehensive metabolic panel   CBC with Differential/Platelet   DG Chest 2 View   Need for Tdap vaccination       Relevant Orders   Tdap vaccine greater than or equal to 7yo IM (Completed)      I am having Gaylyn Cheers start on omeprazole and metoprolol succinate. I am also having her maintain her famotidine, Melatonin, Biotin, loratadine, magnesium, diphenhydrAMINE HCl (BENADRYL PO),  and aspirin EC.  Meds ordered this encounter  Medications  . omeprazole (PRILOSEC) 20 MG capsule    Sig: Take 1 capsule (20 mg total) by mouth daily.    Dispense:  30 capsule    Refill:  3  . metoprolol succinate (TOPROL-XL) 25 MG 24 hr tablet    Sig: Take 1 tablet (25 mg total) by mouth daily.    Dispense:  90 tablet    Refill:  3    There are no discontinued medications.  Follow-up: No follow-ups on file.   Crecencio Mc, MD

## 2019-10-21 ENCOUNTER — Telehealth: Payer: Self-pay | Admitting: Internal Medicine

## 2019-10-21 DIAGNOSIS — Z131 Encounter for screening for diabetes mellitus: Secondary | ICD-10-CM

## 2019-10-21 DIAGNOSIS — R0789 Other chest pain: Secondary | ICD-10-CM | POA: Insufficient documentation

## 2019-10-21 LAB — MICROALBUMIN / CREATININE URINE RATIO
Creatinine,U: 50.7 mg/dL
Microalb Creat Ratio: 1.4 mg/g (ref 0.0–30.0)
Microalb, Ur: <0.7 mg/dL (ref 0.0–1.9)

## 2019-10-21 LAB — COMPREHENSIVE METABOLIC PANEL
ALT: 15 U/L (ref 0–35)
AST: 14 U/L (ref 0–37)
Albumin: 4.4 g/dL (ref 3.5–5.2)
Alkaline Phosphatase: 72 U/L (ref 39–117)
BUN: 11 mg/dL (ref 6–23)
CO2: 25 mEq/L (ref 19–32)
Calcium: 9.5 mg/dL (ref 8.4–10.5)
Chloride: 103 mEq/L (ref 96–112)
Creatinine, Ser: 0.98 mg/dL (ref 0.40–1.20)
GFR: 61.64 mL/min (ref >60.00)
Glucose, Bld: 99 mg/dL (ref 70–99)
Potassium: 3.8 mEq/L (ref 3.5–5.1)
Sodium: 137 mEq/L (ref 135–145)
Total Bilirubin: 0.2 mg/dL (ref 0.2–1.2)
Total Protein: 8 g/dL (ref 6.0–8.3)

## 2019-10-21 LAB — CBC WITH DIFFERENTIAL/PLATELET
Basophils Absolute: 0.1 10*3/uL (ref 0.0–0.1)
Basophils Relative: 0.7 % (ref 0.0–3.0)
Eosinophils Absolute: 0.1 10*3/uL (ref 0.0–0.7)
Eosinophils Relative: 0.8 % (ref 0.0–5.0)
HCT: 39.7 % (ref 36.0–46.0)
Hemoglobin: 12.9 g/dL (ref 12.0–15.0)
Lymphocytes Relative: 27.2 % (ref 12.0–46.0)
Lymphs Abs: 3.1 10*3/uL (ref 0.7–4.0)
MCHC: 32.4 g/dL (ref 30.0–36.0)
MCV: 68.7 fl — ABNORMAL LOW (ref 78.0–100.0)
Monocytes Absolute: 0.8 10*3/uL (ref 0.1–1.0)
Monocytes Relative: 7 % (ref 3.0–12.0)
Neutro Abs: 7.4 10*3/uL (ref 1.4–7.7)
Neutrophils Relative %: 64.3 % (ref 43.0–77.0)
Platelets: 340 10*3/uL (ref 150.0–400.0)
RBC: 5.79 Mil/uL — ABNORMAL HIGH (ref 3.87–5.11)
RDW: 15.2 % (ref 11.5–15.5)
WBC: 11.5 10*3/uL — ABNORMAL HIGH (ref 4.0–10.5)

## 2019-10-21 LAB — VARICELLA ZOSTER ABS, IGG/IGM
Varicella IgM: <0.91 index (ref 0.00–0.90)
Varicella zoster IgG: 1442 index (ref >165)

## 2019-10-21 NOTE — Telephone Encounter (Signed)
And she will also need a  chest x ray since her ekg was normal

## 2019-10-21 NOTE — Assessment & Plan Note (Signed)
Heart exam is normal and screening for thyroid normal.  I have ordered and reviewed a 12 lead EKG and find that there are no acute changes and patient is in sinus rhythm.    Lab Results  Component Value Date   TSH 2.00 05/12/2019   .

## 2019-10-21 NOTE — Assessment & Plan Note (Signed)
Mild, with LDL < 130  By last check.  Repeating today given new onset hypertension,  Lab Results  Component Value Date   CHOL 206 (H) 05/12/2019   HDL 53.50 05/12/2019   LDLCALC 124 (H) 05/12/2019   LDLDIRECT 112.5 12/30/2012   TRIG 145.0 05/12/2019   CHOLHDL 4 05/12/2019

## 2019-10-21 NOTE — Assessment & Plan Note (Signed)
She has gained weight since her last follow up in November.  Given her a1c of 5.7 I have recommended that she adhere to a  low GI diet and continue participation in  regular exercise a minimum of 5 days per week.   Lab Results  Component Value Date   TSH 2.00 05/12/2019   Lab Results  Component Value Date   HGBA1C 5.7 (H) 05/12/2019

## 2019-10-21 NOTE — Telephone Encounter (Signed)
scheduled

## 2019-10-21 NOTE — Telephone Encounter (Signed)
Spoke with pt and she has been scheduled for a nurse visit and an appt to have a chest xray done.

## 2019-10-21 NOTE — Telephone Encounter (Signed)
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    Please ask her to return either today or tomorrow  Address these issues.  She will need 1) TO HAVE A pd PLACED, 2)  A urinalysis,  3)  vision test using the chart in the hallway with glasses or contacts ON.

## 2019-10-21 NOTE — Assessment & Plan Note (Signed)
Her exam is normal,  But given her recent COVID EXPOSURE (with negative testing x 2)  Will obtain chest x ray to rule out scarring and signs of hyperexpansion.  Will also upgrade her GERD medication to omeprazole given the episodes are most noted during eating and may be due to esophageal spasm

## 2019-10-22 LAB — MEASLES/MUMPS/RUBELLA IMMUNITY
Mumps IgG: <9 AU/mL — ABNORMAL LOW
Rubella: 1.66 Index
Rubeola IgG: 50 AU/mL

## 2019-10-22 LAB — QUANTIFERON-TB GOLD PLUS
Mitogen-NIL: >10 IU/mL
NIL: 0.03 IU/mL
QuantiFERON-TB Gold Plus: NEGATIVE
TB1-NIL: <0 IU/mL
TB2-NIL: <0 IU/mL

## 2019-10-29 ENCOUNTER — Other Ambulatory Visit (INDEPENDENT_AMBULATORY_CARE_PROVIDER_SITE_OTHER): Payer: 59

## 2019-10-29 ENCOUNTER — Ambulatory Visit (INDEPENDENT_AMBULATORY_CARE_PROVIDER_SITE_OTHER): Payer: 59

## 2019-10-29 ENCOUNTER — Other Ambulatory Visit: Payer: Self-pay

## 2019-10-29 DIAGNOSIS — Z23 Encounter for immunization: Secondary | ICD-10-CM | POA: Diagnosis not present

## 2019-10-29 DIAGNOSIS — Z111 Encounter for screening for respiratory tuberculosis: Secondary | ICD-10-CM

## 2019-10-29 DIAGNOSIS — Z131 Encounter for screening for diabetes mellitus: Secondary | ICD-10-CM | POA: Diagnosis not present

## 2019-10-29 DIAGNOSIS — R030 Elevated blood-pressure reading, without diagnosis of hypertension: Secondary | ICD-10-CM | POA: Diagnosis not present

## 2019-10-29 DIAGNOSIS — R000 Tachycardia, unspecified: Secondary | ICD-10-CM

## 2019-10-29 DIAGNOSIS — R0789 Other chest pain: Secondary | ICD-10-CM | POA: Diagnosis not present

## 2019-10-29 NOTE — Progress Notes (Signed)
Patient presented for MMR and PPD injection, MMR right deltoid PPD left forearm, patient voiced no concerns nor showed any signs of distress during injection. Patient also completed vision exam.

## 2019-10-30 LAB — URINALYSIS, ROUTINE W REFLEX MICROSCOPIC
Bilirubin Urine: NEGATIVE
Hgb urine dipstick: NEGATIVE
Ketones, ur: NEGATIVE
Leukocytes,Ua: NEGATIVE
Nitrite: NEGATIVE
RBC / HPF: NONE SEEN (ref 0–?)
Specific Gravity, Urine: 1.02 (ref 1.000–1.030)
Total Protein, Urine: NEGATIVE
Urine Glucose: NEGATIVE
Urobilinogen, UA: 0.2 (ref 0.0–1.0)
WBC, UA: NONE SEEN (ref 0–?)
pH: 7 (ref 5.0–8.0)

## 2019-10-31 ENCOUNTER — Ambulatory Visit (INDEPENDENT_AMBULATORY_CARE_PROVIDER_SITE_OTHER): Payer: 59

## 2019-10-31 ENCOUNTER — Other Ambulatory Visit: Payer: Self-pay

## 2019-10-31 DIAGNOSIS — Z111 Encounter for screening for respiratory tuberculosis: Secondary | ICD-10-CM

## 2019-10-31 LAB — TB SKIN TEST
Induration: 0 mm
TB Skin Test: NEGATIVE

## 2019-10-31 NOTE — Progress Notes (Addendum)
Patient came in for PPD read. Patient had a Zero MM induration.   I have reviewed the above information and agree with above.   Duncan Dull, MD

## 2019-11-05 ENCOUNTER — Ambulatory Visit: Payer: 59

## 2019-11-21 ENCOUNTER — Ambulatory Visit: Payer: 59 | Attending: Internal Medicine

## 2019-11-21 DIAGNOSIS — Z23 Encounter for immunization: Secondary | ICD-10-CM

## 2019-11-21 NOTE — Progress Notes (Signed)
   Covid-19 Vaccination Clinic  Name:  Amy Lara    MRN: [id number] DOB: 1976-05-05  11/21/2019  Amy Lara was observed post Covid-19 immunization for 15 minutes without incident. She was provided with Vaccine Information Sheet and instruction to access the V-Safe system.   Amy Lara was instructed to call 911 with any severe reactions post vaccine: Marland Kitchen Difficulty breathing  . Swelling of face and throat  . A fast heartbeat  . A bad rash all over body  . Dizziness and weakness   Immunizations Administered    Name Date Dose VIS Date Route   Pfizer COVID-19 Vaccine 11/21/2019  8:10 AM 0.3 mL 09/10/2018 Intramuscular   Manufacturer: [employer], [employer]   Lot: [id number]   NDC: [address]-7

## 2019-12-16 ENCOUNTER — Ambulatory Visit: Payer: 59 | Attending: Internal Medicine

## 2019-12-16 DIAGNOSIS — Z23 Encounter for immunization: Secondary | ICD-10-CM

## 2019-12-16 NOTE — Progress Notes (Signed)
   Covid-19 Vaccination Clinic  Name:  Amy Lara    MRN: [id number] DOB: [birth year]  12/16/2019  Amy Lara was observed post Covid-19 immunization for 15 minutes without incident. She was provided with Vaccine Information Sheet and instruction to access the V-Safe system.   Amy Lara was instructed to call 911 with any severe reactions post vaccine: Marland Kitchen Difficulty breathing  . Swelling of face and throat  . A fast heartbeat  . A bad rash all over body  . Dizziness and weakness   Immunizations Administered    Name Date Dose VIS Date Route   Pfizer COVID-19 Vaccine 12/16/2019  8:08 AM 0.3 mL 09/10/2018 Intramuscular   Manufacturer: [employer], [employer]   Lot: [id number]   NDC: [address]-5

## 2019-12-19 DIAGNOSIS — Z1159 Encounter for screening for other viral diseases: Secondary | ICD-10-CM

## 2019-12-24 ENCOUNTER — Other Ambulatory Visit (INDEPENDENT_AMBULATORY_CARE_PROVIDER_SITE_OTHER): Payer: 59

## 2019-12-24 ENCOUNTER — Other Ambulatory Visit: Payer: Self-pay

## 2019-12-24 DIAGNOSIS — Z1159 Encounter for screening for other viral diseases: Secondary | ICD-10-CM | POA: Diagnosis not present

## 2019-12-24 NOTE — Telephone Encounter (Signed)
Pt dropped off form to be completed. Please call with any questions and when complete. Placed in colored folder up front

## 2019-12-25 LAB — HEPATITIS B SURFACE ANTIBODY, QUANTITATIVE: Hep B S AB Quant (Post): <5 m[IU]/mL — ABNORMAL LOW (ref >=10)

## 2019-12-31 ENCOUNTER — Ambulatory Visit (INDEPENDENT_AMBULATORY_CARE_PROVIDER_SITE_OTHER): Payer: 59

## 2019-12-31 ENCOUNTER — Other Ambulatory Visit: Payer: Self-pay

## 2019-12-31 DIAGNOSIS — Z23 Encounter for immunization: Secondary | ICD-10-CM | POA: Diagnosis not present

## 2019-12-31 NOTE — Progress Notes (Signed)
Patient presented for HEP B injection to left deltoid, patient voiced no concerns nor showed any signs of distress during injection. 

## 2020-01-14 ENCOUNTER — Ambulatory Visit: Payer: 59 | Admitting: Nurse Practitioner

## 2020-03-02 ENCOUNTER — Ambulatory Visit (INDEPENDENT_AMBULATORY_CARE_PROVIDER_SITE_OTHER): Payer: 59

## 2020-03-02 ENCOUNTER — Other Ambulatory Visit: Payer: Self-pay

## 2020-03-02 DIAGNOSIS — Z23 Encounter for immunization: Secondary | ICD-10-CM

## 2020-03-02 NOTE — Progress Notes (Signed)
Patient presented for Hep B injection to right deltoid, patient voiced no concerns nor showed any signs of distress during injection. 

## 2020-04-12 DIAGNOSIS — R7303 Prediabetes: Secondary | ICD-10-CM

## 2020-04-12 DIAGNOSIS — E785 Hyperlipidemia, unspecified: Secondary | ICD-10-CM

## 2020-04-12 DIAGNOSIS — R002 Palpitations: Secondary | ICD-10-CM

## 2020-04-20 DIAGNOSIS — B078 Other viral warts: Secondary | ICD-10-CM | POA: Diagnosis not present

## 2020-05-07 ENCOUNTER — Other Ambulatory Visit (INDEPENDENT_AMBULATORY_CARE_PROVIDER_SITE_OTHER): Payer: BC Managed Care – PPO

## 2020-05-07 ENCOUNTER — Other Ambulatory Visit: Payer: Self-pay

## 2020-05-07 ENCOUNTER — Other Ambulatory Visit: Payer: BC Managed Care – PPO

## 2020-05-07 DIAGNOSIS — E785 Hyperlipidemia, unspecified: Secondary | ICD-10-CM | POA: Diagnosis not present

## 2020-05-07 DIAGNOSIS — R7303 Prediabetes: Secondary | ICD-10-CM

## 2020-05-07 DIAGNOSIS — R002 Palpitations: Secondary | ICD-10-CM

## 2020-05-07 LAB — LIPID PANEL
Cholesterol: 202 mg/dL — ABNORMAL HIGH (ref 0–200)
HDL: 48.8 mg/dL (ref >39.00)
LDL Cholesterol: 118 mg/dL — ABNORMAL HIGH (ref 0–99)
NonHDL: 153.46
Total CHOL/HDL Ratio: 4
Triglycerides: 176 mg/dL — ABNORMAL HIGH (ref 0.0–149.0)
VLDL: 35.2 mg/dL (ref 0.0–40.0)

## 2020-05-07 LAB — COMPREHENSIVE METABOLIC PANEL
ALT: 11 U/L (ref 0–35)
AST: 12 U/L (ref 0–37)
Albumin: 4 g/dL (ref 3.5–5.2)
Alkaline Phosphatase: 74 U/L (ref 39–117)
BUN: 11 mg/dL (ref 6–23)
CO2: 29 mEq/L (ref 19–32)
Calcium: 8.9 mg/dL (ref 8.4–10.5)
Chloride: 102 mEq/L (ref 96–112)
Creatinine, Ser: 0.82 mg/dL (ref 0.40–1.20)
GFR: 86.9 mL/min (ref >60.00)
Glucose, Bld: 98 mg/dL (ref 70–99)
Potassium: 4.5 mEq/L (ref 3.5–5.1)
Sodium: 139 mEq/L (ref 135–145)
Total Bilirubin: 0.3 mg/dL (ref 0.2–1.2)
Total Protein: 7.2 g/dL (ref 6.0–8.3)

## 2020-05-07 LAB — TSH: TSH: 2.07 u[IU]/mL (ref 0.35–4.50)

## 2020-05-08 LAB — HEMOGLOBIN A1C
Hgb A1c MFr Bld: 5.9 % of total Hgb — ABNORMAL HIGH (ref <5.7)
Mean Plasma Glucose: 123 (calc)
eAG (mmol/L): 6.8 (calc)

## 2020-05-08 NOTE — Progress Notes (Signed)
Attached are your recent fasting labs,  We will discuss in detail at your upcoming visit.  Regards,   Kristen Fromm, MD    

## 2020-05-12 ENCOUNTER — Other Ambulatory Visit: Payer: 59

## 2020-05-17 DIAGNOSIS — B078 Other viral warts: Secondary | ICD-10-CM | POA: Diagnosis not present

## 2020-05-18 ENCOUNTER — Other Ambulatory Visit: Payer: Self-pay

## 2020-05-18 ENCOUNTER — Encounter: Payer: Self-pay | Admitting: Internal Medicine

## 2020-05-18 ENCOUNTER — Ambulatory Visit (INDEPENDENT_AMBULATORY_CARE_PROVIDER_SITE_OTHER): Payer: BC Managed Care – PPO | Admitting: Internal Medicine

## 2020-05-18 ENCOUNTER — Other Ambulatory Visit (HOSPITAL_COMMUNITY)
Admission: RE | Admit: 2020-05-18 | Discharge: 2020-05-18 | Disposition: A | Discharge status: Home / Self Care | Payer: BC Managed Care – PPO | Source: Ambulatory Visit | Attending: Internal Medicine | Admitting: Internal Medicine

## 2020-05-18 VITALS — BP 130/80 | HR 94 | Temp 97.6°F | Resp 15 | Ht 61.0 in | Wt 186.8 lb

## 2020-05-18 DIAGNOSIS — Z0000 Encounter for general adult medical examination without abnormal findings: Secondary | ICD-10-CM | POA: Diagnosis not present

## 2020-05-18 DIAGNOSIS — R002 Palpitations: Secondary | ICD-10-CM | POA: Diagnosis not present

## 2020-05-18 DIAGNOSIS — I10 Essential (primary) hypertension: Secondary | ICD-10-CM

## 2020-05-18 DIAGNOSIS — E785 Hyperlipidemia, unspecified: Secondary | ICD-10-CM | POA: Diagnosis not present

## 2020-05-18 DIAGNOSIS — Z23 Encounter for immunization: Secondary | ICD-10-CM | POA: Diagnosis not present

## 2020-05-18 DIAGNOSIS — R87810 Cervical high risk human papillomavirus (HPV) DNA test positive: Secondary | ICD-10-CM | POA: Insufficient documentation

## 2020-05-18 NOTE — Patient Instructions (Signed)
This is my  example of a  "Low GI"  Diet:  It will allow you to lose 4 to 8  lbs  per month if you follow it carefully.  Your GOAL with exercise is to work up to  30 minutes of aerobic exercise 5 days per week (Walking does not count once it becomes easy!)    All of the foods can be found at grocery stores and in bulk at [employer].  The Atkins protein bars and shakes are available in more varieties at Target, WalMart and [employer].     7 AM Breakfast:  Choose from the following:  Low carbohydrate Protein  Shakes (I recommend the [employer] "Carb Control" shakes  Or the low carb shakes by Atkins.    2.5 carbs)   Arnold's "Sandwhich Thin"toasted  w/ peanut butter (no jelly: about 20 net carbs  "Bagel Thin" with cream cheese and salmon: about 20 carbs   a scrambled egg/bacon/cheese burrito made with Mission's "carb balance" whole wheat tortilla  (about 10 net carbs )  Vilma Meckel makes  a frittata (basically a quiche without the pastry crust) that is made into muffins and microwaves in 2 minutes  If you make your own shakes, avoid bananas and pineapple,  And use low carb greek yogurt or almond milk    Avoid cereal and bananas, oatmeal and cream of wheat and grits. They are loaded with carbohydrates!   10 AM: high protein snack:  Protein bar by Atkins (the snack size, under 200 cal, usually < 6 net carbs).    A stick of cheese:  Around 1 carb,  100 cal     Dannon Light n Fit [place] Yogurt  (80 cal, 8 carbs)  Other so called "protein bars" and Greek yogurts tend to be loaded with carbohydrates.  Remember, in food advertising, the word "energy" is synonymous for " carbohydrate."  Lunch:   A Sandwich using the bread choices listed, Can use any  Eggs,  lunchmeat, grilled meat or canned tuna), avocado, regular mayo/mustard  and cheese.  A Salad using blue cheese, ranch,  Goddess or vinagrette,  Avoid taco shells, croutons or "confetti" and no "candied nuts" but regular nuts OK.   No pretzels or  chips.  Pickles and miniature sweet peppers are a good low carb alternative that provide a "crunch"  The bread is the only source of carbohydrate in a sandwich and  can be decreased by trying some of these alternatives to traditional loaf bread  Joseph's makes a pita bread and a flat bread that are 50 cal and 4 net carbs available at BJs and WalMart.  This can be toasted to use with hummous as well  Toufayan makes a low carb flatbread that's 100 cal and 9 net carbs available at [employer] and [employer] makes 2 sizes of  Low carb whole wheat tortilla  (The large one is 210 cal and 6 net carbs)  Ezekiel bread is a loaf bread sold in the frozen section of higher end grocery chains,  Very low carb  Avoid "Low fat dressings, as well as Reyne Dumas and [address] dressings They are loaded with sugar!   3 PM/ Mid day  Snack:  Consider  1 ounce of  almonds, walnuts, pistachios, pecans, peanuts,  Macadamia nuts or a nut medley.  Avoid "granola"; the dried cranberries and raisins are loaded with carbohydrates. Mixed nuts as long as there are no raisins,  cranberries or dried  fruit.    Try the prosciutto/mozzarella cheese sticks by F[employer]ruci  In deli /backery section   High protein      6 PM  Dinner:     Meat/fowl/fish with a green salad, and either broccoli, cauliflower, green beans, spinach, brussel sprouts or  Lima beans. DO NOT BREAD THE PROTEIN!!      There is a low carb pasta by Dreamfield's that is acceptable and tastes great: only 5 digestible carbs/serving.( All grocery stores but BJs carry it )  Try Kai Levins Angelo's chicken piccata or chicken or eggplant parm over low carb pasta.(Lowes and BJs)   Clifton Custard Sanchez's "Carnitas" (pulled pork, no sauce,  0 carbs) or his beef pot roast to make a dinner burrito (at BJ's)  Pesto over low carb pasta (bj's sells a good quality pesto in the center refrigerated section of the deli   Try satueeing  Roosvelt Harps with mushroooms  Whole wheat pasta is still full  of digestible carbs and  Not as low in glycemic index as Dreamfield's.   Brown rice is still rice,  So skip the rice and noodles if you eat [place] or [place] (or at least limit to 1/2 cup)  9 PM snack :   Breyer's "low carb" fudgsicle or  ice cream bar (Carb Smart line), or  Weight Watcher's ice cream bar , or another "no sugar added" ice cream;  a serving of fresh berries/cherries with whipped cream   Cheese or DANNON'S LlGHT N FIT GREEK YOGURT  8 ounces of [place] unsweetened almond/cococunut milk    Treat yourself to a parfait made with whipped cream blueberiies, walnuts and vanilla greek yogurt  Avoid bananas, pineapple, grapes  and watermelon on a regular basis because they are high in sugar.  THINK OF THEM AS DESSERT  Remember that snack Substitutions should be less than 10 NET carbs per serving and meals < 20 carbs. Remember to subtract fiber grams to get the "net carbs."

## 2020-05-18 NOTE — Assessment & Plan Note (Signed)
Repeat PAP smear was done today  

## 2020-05-18 NOTE — Progress Notes (Signed)
Patient ID: Amy Lara, female    DOB: 08-08-1975  Age: 44 y.o. MRN: [id number]  The patient is here for annual Medicare wellness examination and management of other chronic and acute problems.   The risk factors are reflected in the social history.  The roster of all physicians providing medical care to patient - is listed in the Snapshot section of the chart.  Activities of daily living:  The patient is 100% independent in all ADLs: dressing, toileting, feeding as well as independent mobility  Home safety : The patient has smoke detectors in the home. They wear seatbelts.  There are no firearms at home. There is no violence in the home.   There is no risks for hepatitis, STDs or HIV. There is no   history of blood transfusion. They have no travel history to infectious disease endemic areas of the world.  The patient has seen their dentist in the last six month. They have seen their eye doctor in the last year. They admit to slight hearing difficulty with regard to whispered voices and some television programs.  They have deferred audiologic testing in the last year.  They do not  have excessive sun exposure. Discussed the need for sun protection: hats, long sleeves and use of sunscreen if there is significant sun exposure.   Diet: the importance of a healthy diet is discussed. They do have a healthy diet.  The benefits of regular aerobic exercise were discussed. She walks 4 times per week ,  20 minutes.   Depression screen: there are no signs or vegative symptoms of depression- irritability, change in appetite, anhedonia, sadness/tearfullness.  The following portions of the patient's history were reviewed and updated as appropriate: allergies, current medications, past family history, past medical history,  past surgical history, past social history  and problem list.  Visual acuity was not assessed per patient preference since she has regular follow up with her ophthalmologist. Hearing  and body mass index were assessed and reviewed.   During the course of the visit the patient was educated and counseled about appropriate screening and preventive services including : fall prevention , diabetes screening, nutrition counseling, colorectal cancer screening, and recommended immunizations.    CC: The primary encounter diagnosis was Encounter for preventive health examination. Diagnoses of Cervical high risk HPV (human papillomavirus) test positive, Need for immunization against influenza, Essential hypertension, Hyperlipidemia with target LDL less than 160, and Palpitations with regular cardiac rhythm were also pertinent to this visit.  1) Patient is taking her medications as prescribed and notes no adverse effects.  Home BP readings have been done about once per week and are  generally < 130/80 .  She is avoiding added salt in her diet and walking regularly about 3 times per week for exercise  .  History  Amy Lara has a past medical history of Acquired pes planus of both feet, Allergy, Asthma, Fibrocystic breast disease in female, GERD (gastroesophageal reflux disease), Hyperlipidemia, Kidney stones, Plantar fasciitis, bilateral, and Thalassemia carrier.   She has a past surgical history that includes Tubal ligation.   Her family history includes Atrial fibrillation (age of onset: 56) in her mother; Cancer in her father and paternal uncle; Heart disease in her paternal grandmother; Hyperlipidemia in her father, mother, paternal aunt, paternal grandfather, and paternal uncle.She reports that she has never smoked. She has never used smokeless tobacco. She reports current alcohol use. She reports that she does not use drugs.  Outpatient Medications Prior to Visit  Medication Sig Dispense Refill  . aspirin EC 81 MG tablet Take 81 mg by mouth daily.    . Biotin 10 MG TABS Take 1 tablet by mouth.    . diphenhydrAMINE HCl (BENADRYL PO) Take by mouth.    . famotidine (PEPCID) 10 MG tablet  Take 10 mg by mouth 2 (two) times daily.    Marland Kitchen loratadine (CLARITIN) 10 MG tablet Take 10 mg by mouth daily.    . magnesium 30 MG tablet Take 30 mg by mouth 2 (two) times daily.    . Melatonin 10 MG TABS Take 1 tablet by mouth daily as needed.    . metoprolol succinate (TOPROL-XL) 25 MG 24 hr tablet Take 1 tablet (25 mg total) by mouth daily. 90 tablet 3  . omeprazole (PRILOSEC) 20 MG capsule Take 1 capsule (20 mg total) by mouth daily. 30 capsule 3   No facility-administered medications prior to visit.    Review of Systems  Patient denies headache, fevers, malaise, unintentional weight loss, skin rash, eye pain, sinus congestion and sinus pain, sore throat, dysphagia,  hemoptysis , cough, dyspnea, wheezing, chest pain, palpitations, orthopnea, edema, abdominal pain, nausea, melena, diarrhea, constipation, flank pain, dysuria, hematuria, urinary  Frequency, nocturia, numbness, tingling, seizures,  Focal weakness, Loss of consciousness,  Tremor, insomnia, depression, anxiety, and suicidal ideation.     Objective:  BP 130/80 (BP Location: Left Arm, Patient Position: Sitting, Cuff Size: Normal)   Pulse 94   Temp 97.6 F (36.4 C) (Oral)   Resp 15   Ht 5\' 1"  (1.549 m)   Wt 186 lb 12.8 oz (84.7 kg)   SpO2 96%   BMI 35.30 kg/m   Physical Exam    Assessment & Plan:   Problem List Items Addressed This Visit      Unprioritized   Cervical high risk HPV (human papillomavirus) test positive    Repeat PAP smear was done today      Relevant Orders   Cytology - PAP( [place])   Encounter for preventive health examination - Primary    age appropriate education and counseling updated, referrals for preventative services and immunizations addressed, dietary and smoking counseling addressed, most recent labs reviewed.  I have personally reviewed and have noted:  1) the patient's medical and social history 2) The pt's use of alcohol, tobacco, and illicit drugs 3) The patient's current  medications and supplements 4) Functional ability including ADL's, fall risk, home safety risk, hearing and visual impairment 5) Diet and physical activities 6) Evidence for depression or mood disorder 7) The patient's height, weight, and BMI have been recorded in the chart  I have made referrals, and provided counseling and education based on review of the above      Essential hypertension    Her home readings have been 130/80 or less on metoprolol.  Advised to continue checking weekly   Lab Results  Component Value Date   CREATININE 0.82 05/07/2020   Lab Results  Component Value Date   NA 139 05/07/2020   K 4.5 05/07/2020   CL 102 05/07/2020   CO2 29 05/07/2020         Hyperlipidemia with target LDL less than 160    Mild, with LDL < 130  By last check. No indication for treatment  Lab Results  Component Value Date   CHOL 202 (H) 05/07/2020   HDL 48.80 05/07/2020   LDLCALC 118 (H) 05/07/2020   LDLDIRECT 112.5 12/30/2012   TRIG 176.0 (H) 05/07/2020  CHOLHDL 4 05/07/2020         Palpitations with regular cardiac rhythm    Improved with use of metoprolol       Other Visit Diagnoses    Need for immunization against influenza       Relevant Orders   Flu Vaccine QUAD 36+ mos IM (Completed)      I am having Amy Lara maintain her famotidine, Melatonin, Biotin, loratadine, magnesium, diphenhydrAMINE HCl (BENADRYL PO), aspirin EC, omeprazole, and metoprolol succinate.  No orders of the defined types were placed in this encounter.   There are no discontinued medications.  Follow-up: No follow-ups on file.   Sherlene Shams, MD

## 2020-05-18 NOTE — Assessment & Plan Note (Signed)

## 2020-05-18 NOTE — Assessment & Plan Note (Signed)
Mild, with LDL < 130  By last check. No indication for treatment  Lab Results  Component Value Date   CHOL 202 (H) 05/07/2020   HDL 48.80 05/07/2020   LDLCALC 118 (H) 05/07/2020   LDLDIRECT 112.5 12/30/2012   TRIG 176.0 (H) 05/07/2020   CHOLHDL 4 05/07/2020

## 2020-05-18 NOTE — Assessment & Plan Note (Addendum)
Her home readings have been 130/80 or less on metoprolol.  Advised to continue checking weekly   Lab Results  Component Value Date   CREATININE 0.82 05/07/2020   Lab Results  Component Value Date   NA 139 05/07/2020   K 4.5 05/07/2020   CL 102 05/07/2020   CO2 29 05/07/2020

## 2020-05-18 NOTE — Assessment & Plan Note (Signed)
Improved with use of metoprolol

## 2020-05-24 LAB — CYTOLOGY - PAP
Adequacy: ABSENT
Comment: NEGATIVE
Diagnosis: NEGATIVE
High risk HPV: NEGATIVE

## 2020-05-26 NOTE — Progress Notes (Signed)
  Your PAP smear was HPV negative but the endocervical zone was not present . Current guidelines for women over 30 with these results recommend continued routine screening,  So we will repeat your PAP smear in a year.

## 2020-07-01 DIAGNOSIS — Z1231 Encounter for screening mammogram for malignant neoplasm of breast: Secondary | ICD-10-CM

## 2020-07-13 ENCOUNTER — Other Ambulatory Visit: Payer: Self-pay

## 2020-07-13 ENCOUNTER — Ambulatory Visit
Admission: RE | Admit: 2020-07-13 | Discharge: 2020-07-13 | Disposition: A | Discharge status: Home / Self Care | Payer: BC Managed Care – PPO | Source: Ambulatory Visit | Attending: Internal Medicine | Admitting: Internal Medicine

## 2020-07-13 DIAGNOSIS — Z1231 Encounter for screening mammogram for malignant neoplasm of breast: Secondary | ICD-10-CM | POA: Diagnosis not present

## 2020-07-14 ENCOUNTER — Other Ambulatory Visit: Payer: Self-pay | Admitting: Internal Medicine

## 2020-08-16 ENCOUNTER — Ambulatory Visit
Admission: EM | Admit: 2020-08-16 | Discharge: 2020-08-16 | Disposition: A | Discharge status: Home / Self Care | Payer: BC Managed Care – PPO | Attending: Physician Assistant | Admitting: Physician Assistant

## 2020-08-16 ENCOUNTER — Ambulatory Visit (INDEPENDENT_AMBULATORY_CARE_PROVIDER_SITE_OTHER): Payer: BC Managed Care – PPO

## 2020-08-16 ENCOUNTER — Other Ambulatory Visit: Payer: Self-pay

## 2020-08-16 ENCOUNTER — Encounter: Payer: Self-pay | Admitting: Emergency Medicine

## 2020-08-16 ENCOUNTER — Ambulatory Visit: Payer: Self-pay

## 2020-08-16 DIAGNOSIS — J189 Pneumonia, unspecified organism: Secondary | ICD-10-CM | POA: Diagnosis not present

## 2020-08-16 DIAGNOSIS — R059 Cough, unspecified: Secondary | ICD-10-CM | POA: Diagnosis not present

## 2020-08-16 DIAGNOSIS — U071 COVID-19: Secondary | ICD-10-CM | POA: Diagnosis not present

## 2020-08-16 DIAGNOSIS — Z8616 Personal history of COVID-19: Secondary | ICD-10-CM | POA: Diagnosis not present

## 2020-08-16 HISTORY — DX: Tachycardia, unspecified: R00.0

## 2020-08-16 HISTORY — DX: Essential (primary) hypertension: I10

## 2020-08-16 MED ORDER — FLUCONAZOLE 150 MG PO TABS: 150.0000 mg | ORAL_TABLET | Freq: Every day | ORAL | 0 refills | Status: DC

## 2020-08-16 MED ORDER — PROAIR RESPICLICK 108 (90 BASE) MCG/ACT IN AEPB: INHALATION_SPRAY | RESPIRATORY_TRACT | 0 refills | Status: DC

## 2020-08-16 MED ORDER — ALBUTEROL SULFATE HFA 108 (90 BASE) MCG/ACT IN AERS: 2.0000 | INHALATION_SPRAY | RESPIRATORY_TRACT | 0 refills | Status: DC | PRN

## 2020-08-16 MED ORDER — CEFDINIR 300 MG PO CAPS: 300.0000 mg | ORAL_CAPSULE | Freq: Two times a day (BID) | ORAL | 0 refills | Status: DC

## 2020-08-16 NOTE — ED Triage Notes (Signed)
Patient in today c/o continued cough from covid on 07/15/20 and lightheadedness x last night. Patient states she is having some tightness in her chest when she coughs. Patient has had the covid vaccines.

## 2020-08-16 NOTE — ED Provider Notes (Signed)
[facility]    CSN: [id number] Arrival date & time: 08/16/20  1636      History   Chief Complaint Chief Complaint  Patient presents with  . Cough  . Dizziness    HPI Amy Lara is a 45 y.o. female who continues having a cough since diagnosed with covid 12/20. Cough is non productive and has had cough attacks. Last night when walking to her car in the cold felt SOB with activity and the cold provoked her cough. The cough is waking her up at night. Has been waking up sweaty in the am's. Been chilling at night time.      Past Medical History:  Diagnosis Date  . Acquired pes planus of both feet   . Allergy   . Asthma   . Fibrocystic breast disease in female    no prior biopsy  . GERD (gastroesophageal reflux disease)   . Hyperlipidemia   . Hypertension   . Kidney stones   . Plantar fasciitis, bilateral   . Tachycardia   . Thalassemia carrier     Patient Active Problem List   Diagnosis Date Noted  . Cervical high risk HPV (human papillomavirus) test positive 05/18/2020  . Feeling of chest tightness 10/21/2019  . Essential hypertension 05/16/2019  . Obesity (BMI 30.0-34.9) 05/14/2018  . Palpitations with regular cardiac rhythm 05/14/2018  . Breast cancer screening 01/18/2016  . Encounter for preventive health examination 01/07/2013  . Hyperlipidemia with target LDL less than 160 11/19/2012  . Fibrocystic breast disease in female   . Plantar fasciitis, bilateral   . Acquired pes planus of both feet   . Thalassemia carrier     Past Surgical History:  Procedure Laterality Date  . TUBAL LIGATION      OB History   No obstetric history on file.      Home Medications    Prior to Admission medications   Medication Sig Start Date End Date Taking? Authorizing Provider  Albuterol Sulfate (PROAIR RESPICLICK) 108 (90 Base) MCG/ACT AEPB 2 puffs q 6h prn cough attack x 5-7 days, then prn 08/16/20  Yes Rodriguez-Southworth, Viviana Simpler  aspirin EC 81 MG  tablet Take 81 mg by mouth daily.   Yes [provider]  cefdinir (OMNICEF) 300 MG capsule Take 1 capsule (300 mg total) by mouth 2 (two) times daily. 08/16/20  Yes Rodriguez-Southworth, Nettie Elm, PA-C  diphenhydrAMINE HCl (BENADRYL PO) Take by mouth.   Yes [provider]  famotidine (PEPCID) 10 MG tablet Take 10 mg by mouth 2 (two) times daily.   Yes [provider]  fluconazole (DIFLUCAN) 150 MG tablet Take 1 tablet (150 mg total) by mouth daily. 08/16/20  Yes Rodriguez-Southworth, Nettie Elm, PA-C  loratadine (CLARITIN) 10 MG tablet Take 10 mg by mouth daily.   Yes [provider]  Melatonin 10 MG TABS Take 1 tablet by mouth daily as needed.   Yes [provider]  metoprolol succinate (TOPROL-XL) 25 MG 24 hr tablet Take 1 tablet (25 mg total) by mouth daily. 10/20/19  Yes Sherlene Shams, MD  omeprazole (PRILOSEC) 20 MG capsule TAKE 1 CAPSULE BY MOUTH EVERY DAY 07/14/20  Yes Sherlene Shams, MD    Family History Family History  Problem Relation Age of Onset  . Hyperlipidemia Mother   . Atrial fibrillation Mother 49  . Cancer Father        lung cancer died November 07, 2008  . Hyperlipidemia Father   . Heart disease Paternal Grandmother   .  Hyperlipidemia Paternal Grandfather   . Hyperlipidemia Paternal Aunt   . Hyperlipidemia Paternal Uncle   . Cancer Paternal Uncle        prostate ca  . Breast cancer Neg Hx     Social History Social History   Tobacco Use  . Smoking status: Never Smoker  . Smokeless tobacco: Never Used  Vaping Use  . Vaping Use: Never used  Substance Use Topics  . Alcohol use: Yes    Comment: occasioally  . Drug use: No     Allergies   Vaccinium angustifolium, Latex, and Sulfonamide derivatives   Review of Systems Review of Systems + dizziness when she gets up from sitting. Admits she has not been drinking well. + cough which is non productive and cant take full deep breaths. Has been having night sweats. Being out in the  cold last night cause cough attack and SOB. The rest of ROS are neg.   Physical Exam Triage Vital Signs ED Triage Vitals  Enc Vitals Group     BP 08/16/20 1655 (!) 146/89     Pulse Rate 08/16/20 1655 (!) 118     Resp 08/16/20 1655 18     Temp 08/16/20 1655 99 F (37.2 C)     Temp Source 08/16/20 1655 Oral     SpO2 08/16/20 1655 100 %     Weight 08/16/20 1656 185 lb (83.9 kg)     Height 08/16/20 1656 5' 1.5" (1.562 m)     Head Circumference --      Peak Flow --      Pain Score 08/16/20 1655 2     Pain Loc --      Pain Edu? --      Excl. in GC? --    No data found.  Updated Vital Signs BP (!) 146/89 (BP Location: Left Arm)   Pulse (!) 118   Temp 99 F (37.2 C) (Oral)   Resp 18   Ht 5' 1.5" (1.562 m)   Wt 185 lb (83.9 kg)   LMP 08/02/2020 (Approximate)   SpO2 100%   BMI 34.39 kg/m   Visual Acuity Right Eye Distance:   Left Eye Distance:   Bilateral Distance:    Right Eye Near:   Left Eye Near:    Bilateral Near:     Physical Exam Physical Exam Vitals signs and nursing note reviewed.  Constitutional:      General: She is not in acute distress.    Appearance: Normal appearance. She is not ill-appearing, toxic-appearing or diaphoretic.  HENT:     Head: Normocephalic.     Right Ear: Tympanic membrane, ear canal and external ear normal.     Left Ear: Tympanic membrane, ear canal and external ear normal.     Nose: Nose normal.     Mouth/Throat:     Mouth: Mucous membranes are moist.  Eyes:     General: No scleral icterus.       Right eye: No discharge.        Left eye: No discharge.     Conjunctiva/sclera: Conjunctivae normal.  Neck:     Musculoskeletal: Neck supple. No neck rigidity.  Cardiovascular:     Rate and Rhythm: Normal rate and regular rhythm.     Heart sounds: No murmur.  Pulmonary:     Effort: Pulmonary effort is normal.     Breath sounds: Normal breath sounds.  Abdominal:     General: Bowel sounds are normal. There is no distension.  Palpations: Abdomen is soft. There is no mass.     Tenderness: There is no abdominal tenderness. There is no guarding or rebound.     Hernia: No hernia is present.  Musculoskeletal: Normal range of motion.  Lymphadenopathy:     Cervical: No cervical adenopathy.  Skin:    General: Skin is warm and dry.     Coloration: Skin is not jaundiced.     Findings: No rash.  Neurological:     Mental Status: She is alert and oriented to person, place, and time.     Gait: Gait normal.  Psychiatric:        Mood and Affect: Mood normal.        Behavior: Behavior normal.        Thought Content: Thought content normal.        Judgment: Judgment normal.   UC Treatments / Results  Labs (all labs ordered are listed, but only abnormal results are displayed) Labs Reviewed - No data to display  EKG   Radiology DG Chest 2 View  Result Date: 08/16/2020 CLINICAL DATA:  Cough. Cough since pain COVID positive 07/15/2020 lightheadedness. EXAM: CHEST - 2 VIEW COMPARISON:  10/29/2019 FINDINGS: The cardiomediastinal contours are normal. Subsegmental opacity in the right mid lung. Pulmonary vasculature is normal. No confluent consolidation, pleural effusion, or pneumothorax. No acute osseous abnormalities are seen. IMPRESSION: Subsegmental opacity in the right mid lung, may represent atelectasis or minimal pneumonia in the setting of COVID 19. Electronically Signed   By: Narda Rutherford M.D.   On: 08/16/2020 17:32    Procedures Procedures (including critical care time)  Medications Ordered in UC Medications - No data to display  Initial Impression / Assessment and Plan / UC Course  I have reviewed the triage vital signs and the nursing notes.  Pertinent labs & imaging results that were available during my care of the patient were reviewed by me and considered in my medical decision making (see chart for details).  Final Clinical Impressions(s) / UC Diagnoses   Final diagnoses:  Pneumonia of right  middle lobe due to infectious organism     Discharge Instructions     Have the chest xray repeated in 6-8 weeks to make sure it has resolved.     ED Prescriptions    Medication Sig Dispense Auth. Provider   cefdinir (OMNICEF) 300 MG capsule Take 1 capsule (300 mg total) by mouth 2 (two) times daily. 20 capsule Rodriguez-Southworth, Nettie Elm, PA-C   Albuterol Sulfate (PROAIR RESPICLICK) 108 (90 Base) MCG/ACT AEPB 2 puffs q 6h prn cough attack x 5-7 days, then prn 1 each Rodriguez-Southworth, Jamilex Bohnsack, PA-C   fluconazole (DIFLUCAN) 150 MG tablet Take 1 tablet (150 mg total) by mouth daily. 1 tablet Rodriguez-Southworth, Nettie Elm, PA-C     I have reviewed the PDMP during this encounter.   Garey Ham, Cordelia Poche 08/16/20 1934

## 2020-08-16 NOTE — Discharge Instructions (Addendum)
Have the chest xray repeated in 6-8 weeks to make sure it has resolved.

## 2020-08-17 ENCOUNTER — Telehealth: Payer: BC Managed Care – PPO | Admitting: Family Medicine

## 2020-09-06 ENCOUNTER — Ambulatory Visit (INDEPENDENT_AMBULATORY_CARE_PROVIDER_SITE_OTHER): Payer: BC Managed Care – PPO | Admitting: Internal Medicine

## 2020-09-06 ENCOUNTER — Encounter: Payer: Self-pay | Admitting: Internal Medicine

## 2020-09-06 ENCOUNTER — Other Ambulatory Visit: Payer: Self-pay

## 2020-09-06 ENCOUNTER — Ambulatory Visit (INDEPENDENT_AMBULATORY_CARE_PROVIDER_SITE_OTHER): Payer: BC Managed Care – PPO

## 2020-09-06 VITALS — BP 136/90 | HR 114 | Temp 97.6°F | Ht 61.5 in | Wt 191.6 lb

## 2020-09-06 DIAGNOSIS — R918 Other nonspecific abnormal finding of lung field: Secondary | ICD-10-CM | POA: Diagnosis not present

## 2020-09-06 DIAGNOSIS — R9389 Abnormal findings on diagnostic imaging of other specified body structures: Secondary | ICD-10-CM | POA: Diagnosis not present

## 2020-09-06 DIAGNOSIS — R000 Tachycardia, unspecified: Secondary | ICD-10-CM | POA: Diagnosis not present

## 2020-09-06 MED ORDER — METOPROLOL SUCCINATE ER 25 MG PO TB24: 25.0000 mg | ORAL_TABLET | Freq: Every day | ORAL | 3 refills | Status: DC

## 2020-09-06 NOTE — Assessment & Plan Note (Signed)
RML opacity noted on Jan 31 x ray after COVID POSITIVE illnss on Dec 30.  reChecking today

## 2020-09-06 NOTE — Assessment & Plan Note (Addendum)
Etiology unclear. EKG is clearly sinus rhythm. Ambulatory pulse increases today on a 1 minute walk to 112,  02 stas on room air increase from 96 to 97% .   She reports a normal pulse at rest (acutally in 40-50's since starting metoprolol).  Exercise induced suggestive of deconditioning, but will send to cardiology for rule out cardiomyopathy/arrhythmia.   continue 25 mg toprol

## 2020-09-06 NOTE — Progress Notes (Signed)
Subjective:  Patient ID: Amy Lara, female    DOB: September 25, 1975  Age: 45 y.o. MRN: [id number]  CC: The primary encounter diagnosis was Abnormal chest x-ray. A diagnosis of Tachycardia, unspecified was also pertinent to this visit.  HPI Amy Lara presents for follow up on  1) tachyardia  And 2) abnormal chest x ray    This visit occurred during the SARS-CoV-2 public health emergency.  Safety protocols were in place, including screening questions prior to the visit, additional usage of staff PPE, and extensive cleaning of exam room while observing appropriate contact time as indicated for disinfecting solutions.    1)tachycardia:   First noted in April. TSH normal  CBC relatively normal (has thalassemia)  EKG normal  Metoprolol started. Reviewed  Her [employer] phone records which note that her pulse is normal at rest and actually  drops in to the 40's at night. increases with activity up to 120 bpm .  Se is currently taking 25 mg metoprolol at night.  .she has gained weight over the last year.    discussed cardiology referral   2)  Pneumonia: she had COVID INFECTION Dec 30 .  Mild symptoms..  Then developed a persistent cough that was accompanied by recurrent episodes of light headedness with fever or dyspnea.   Chest ray done  Jan 31 at [facility] noted a subsegmental opacity I RML; she was treated with Omnicef.  cough resolved last week .   She is a never smoker, and does not take [facility] (tubal ligation)    Outpatient Medications Prior to Visit  Medication Sig Dispense Refill  . albuterol (VENTOLIN HFA) 108 (90 Base) MCG/ACT inhaler Inhale 2 puffs into the lungs every 4 (four) hours as needed for wheezing or shortness of breath. And cough attacks 1 each 0  . aspirin EC 81 MG tablet Take 81 mg by mouth daily.    . diphenhydrAMINE HCl (BENADRYL PO) Take by mouth.    . famotidine (PEPCID) 10 MG tablet Take 10 mg by mouth 2 (two) times daily.    Marland Kitchen loratadine (CLARITIN) 10 MG tablet Take 10  mg by mouth daily.    . Melatonin 10 MG TABS Take 1 tablet by mouth daily as needed.    Marland Kitchen omeprazole (PRILOSEC) 20 MG capsule TAKE 1 CAPSULE BY MOUTH EVERY DAY 30 capsule 3  . metoprolol succinate (TOPROL-XL) 25 MG 24 hr tablet Take 1 tablet (25 mg total) by mouth daily. 90 tablet 3  . cefdinir (OMNICEF) 300 MG capsule Take 1 capsule (300 mg total) by mouth 2 (two) times daily. (Patient not taking: Reported on 09/06/2020) 20 capsule 0  . fluconazole (DIFLUCAN) 150 MG tablet Take 1 tablet (150 mg total) by mouth daily. (Patient not taking: Reported on 09/06/2020) 1 tablet 0   No facility-administered medications prior to visit.    Review of Systems;  Patient denies headache, fevers, malaise, unintentional weight loss, skin rash, eye pain, sinus congestion and sinus pain, sore throat, dysphagia,  hemoptysis , cough, dyspnea, wheezing, chest pain, palpitations, orthopnea, edema, abdominal pain, nausea, melena, diarrhea, constipation, flank pain, dysuria, hematuria, urinary  Frequency, nocturia, numbness, tingling, seizures,  Focal weakness, Loss of consciousness,  Tremor, insomnia, depression, anxiety, and suicidal ideation.      Objective:  BP 136/90 (BP Location: Left Arm, Patient Position: Sitting)   Pulse (!) 114   Temp 97.6 F (36.4 C)   Ht 5' 1.5" (1.562 m)   Wt 191 lb 9.6 oz (86.9 kg)  SpO2 99%   BMI 35.62 kg/m   BP Readings from Last 3 Encounters:  09/06/20 136/90  08/16/20 (!) 146/89  05/18/20 130/80    Wt Readings from Last 3 Encounters:  09/06/20 191 lb 9.6 oz (86.9 kg)  08/16/20 185 lb (83.9 kg)  05/18/20 186 lb 12.8 oz (84.7 kg)    General appearance: alert, cooperative and appears stated age Ears: normal TM's and external ear canals both ears Throat: lips, mucosa, and tongue normal; teeth and gums normal Neck: no adenopathy, no carotid bruit, supple, symmetrical, trachea midline and thyroid not enlarged, symmetric, no tenderness/mass/nodules Back: symmetric, no  curvature. ROM normal. No CVA tenderness. Lungs: clear to auscultation bilaterally Heart: regular rate and rhythm, S1, S2 normal, hyperkinetic no murmur, click, rub or gallop Abdomen: soft, non-tender; bowel sounds normal; no masses,  no organomegaly Pulses: 2+ and symmetric Skin: Skin color, texture, turgor normal. No rashes or lesions Lymph nodes: Cervical, supraclavicular, and axillary nodes normal.  Lab Results  Component Value Date   HGBA1C 5.9 (H) 05/07/2020   HGBA1C 5.7 (H) 05/12/2019   HGBA1C 5.7 (H) 05/13/2018    Lab Results  Component Value Date   CREATININE 0.82 05/07/2020   CREATININE 0.98 10/20/2019   CREATININE 0.78 05/12/2019    Lab Results  Component Value Date   WBC 11.5 (H) 10/20/2019   HGB 12.9 10/20/2019   HCT 39.7 10/20/2019   PLT 340.0 10/20/2019   GLUCOSE 98 05/07/2020   CHOL 202 (H) 05/07/2020   TRIG 176.0 (H) 05/07/2020   HDL 48.80 05/07/2020   LDLDIRECT 112.5 12/30/2012   LDLCALC 118 (H) 05/07/2020   ALT 11 05/07/2020   AST 12 05/07/2020   NA 139 05/07/2020   K 4.5 05/07/2020   CL 102 05/07/2020   CREATININE 0.82 05/07/2020   BUN 11 05/07/2020   CO2 29 05/07/2020   TSH 2.07 05/07/2020   HGBA1C 5.9 (H) 05/07/2020   MICROALBUR <0.7 10/20/2019    DG Chest 2 View  Result Date: 08/16/2020 CLINICAL DATA:  Cough. Cough since pain COVID positive 07/15/2020 lightheadedness. EXAM: CHEST - 2 VIEW COMPARISON:  10/29/2019 FINDINGS: The cardiomediastinal contours are normal. Subsegmental opacity in the right mid lung. Pulmonary vasculature is normal. No confluent consolidation, pleural effusion, or pneumothorax. No acute osseous abnormalities are seen. IMPRESSION: Subsegmental opacity in the right mid lung, may represent atelectasis or minimal pneumonia in the setting of COVID 19. Electronically Signed   By: Narda Rutherford M.D.   On: 08/16/2020 17:32    Assessment & Plan:   Problem List Items Addressed This Visit      Unprioritized   Tachycardia,  unspecified    Etiology unclear. EKG is clearly sinus rhythm. Ambulatory pulse increases today on a 1 minute walk to 112,  02 stas on room air increase from 96 to 97% .   She reports a normal pulse at rest (acutally in 40-50's since starting metoprolol).  Exercise induced suggestive of deconditioning, but will send to cardiology for rule out cardiomyopathy/arrhythmia.   continue 25 mg toprol      Relevant Orders   Ambulatory referral to Cardiology   Abnormal chest x-ray - Primary    RML opacity noted on Jan 31 x ray after COVID POSITIVE illnss on Dec 30.  reChecking today       Relevant Orders   DG Chest 2 View      I have discontinued Natarsha Hagin's cefdinir and fluconazole. I am also having her maintain her famotidine, Melatonin, loratadine,  diphenhydrAMINE HCl (BENADRYL PO), aspirin EC, omeprazole, albuterol, and metoprolol succinate.  Meds ordered this encounter  Medications  . metoprolol succinate (TOPROL-XL) 25 MG 24 hr tablet    Sig: Take 1 tablet (25 mg total) by mouth daily.    Dispense:  90 tablet    Refill:  3    Medications Discontinued During This Encounter  Medication Reason  . fluconazole (DIFLUCAN) 150 MG tablet Error  . cefdinir (OMNICEF) 300 MG capsule Error  . metoprolol succinate (TOPROL-XL) 25 MG 24 hr tablet Reorder    Follow-up: No follow-ups on file.   Sherlene Shams, MD

## 2020-09-20 NOTE — Progress Notes (Signed)
New Outpatient Visit Date: 09/23/2020  Referring Provider: Sherlene Shams, MD [address] [place],  [place] [address]  Chief Complaint: Elevated heart rate  HPI:  Amy Lara is a 45 y.o. female who is being seen today for the evaluation of tachycardia at the request of Dr. Darrick Huntsman. She has a history of borderline hypertension, hyperlipidemia, asthma, and GERD.  Amy Lara notes that she has had an elevated heart rate for quite some time and was started on metoprolol by Dr. Darrick Huntsman about a year ago.  She is largely unaware of her elevated heart rates, though it sometimes feels like it is racing when she is very active.  Her heart rate monitor shows readings ranging from 40 bpm (at rest) to 180 bpm (with activity).  Amy Lara does not feel any different since starting metoprolol.  Amy Lara denies a history of prior cardiac disease.  She has not had any chest pain, shortness of breath, lightheadedness, edema, or orthopnea.  She notes sporadic episodes of PND, however,  She has been diagnosed with reflux-induced asthma and has an albuterol inhaler available (though she does not use it regularly).  She has never been evaluated for sleep apnea.  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  --------------------------------------------------------------------------------------------------  Cardiovascular History & Procedures: Cardiovascular Problems:  Tachycardia  Risk Factors:  Hypertension, hyperlipidemia, and obesity  Cath/PCI:  None  CV Surgery:  None  EP Procedures and Devices:  None  Non-Invasive Evaluation(s):  None  Recent CV Pertinent Labs: Lab Results  Component Value Date   CHOL 202 (H) 05/07/2020   HDL 48.80 05/07/2020   LDLCALC 118 (H) 05/07/2020   LDLDIRECT 112.5 12/30/2012   TRIG 176.0 (H) 05/07/2020   CHOLHDL 4 05/07/2020   K 4.5 05/07/2020   BUN 11 05/07/2020   CREATININE 0.82 05/07/2020     --------------------------------------------------------------------------------------------------  Past Medical History:  Diagnosis Date  . Acquired pes planus of both feet   . Allergy   . Asthma   . Fibrocystic breast disease in female    no prior biopsy  . GERD (gastroesophageal reflux disease)   . Hyperlipidemia   . Hypertension   . Kidney stones   . Plantar fasciitis, bilateral   . Tachycardia   . Thalassemia carrier     Past Surgical History:  Procedure Laterality Date  . TUBAL LIGATION      Current Meds  Medication Sig  . albuterol (VENTOLIN HFA) 108 (90 Base) MCG/ACT inhaler Inhale 2 puffs into the lungs every 4 (four) hours as needed for wheezing or shortness of breath. And cough attacks  . aspirin EC 81 MG tablet Take 81 mg by mouth daily.  . diphenhydrAMINE HCl (BENADRYL PO) Take by mouth as needed.  . famotidine (PEPCID) 10 MG tablet Take 10 mg by mouth 2 (two) times daily.  Marland Kitchen loratadine (CLARITIN) 10 MG tablet Take 10 mg by mouth daily as needed.  . Melatonin 10 MG TABS Take 1 tablet by mouth daily as needed.  . metoprolol succinate (TOPROL-XL) 25 MG 24 hr tablet Take 1 tablet (25 mg total) by mouth daily.  Marland Kitchen omeprazole (PRILOSEC) 20 MG capsule TAKE 1 CAPSULE BY MOUTH EVERY DAY    Allergies: Vaccinium angustifolium, Latex, and Sulfonamide derivatives  Social History   Tobacco Use  . Smoking status: Never Smoker  . Smokeless tobacco: Never Used  Vaping Use  . Vaping Use: Never used  Substance Use Topics  .  Alcohol use: Yes    Comment: occasioally  . Drug use: No    Family History  Problem Relation Age of Onset  . Hyperlipidemia Mother   . Atrial fibrillation Mother 51  . Cancer Father        lung cancer died 10-27-2008  . Hyperlipidemia Father   . Heart disease Paternal Grandmother   . Hyperlipidemia Paternal Grandfather   . Hyperlipidemia Paternal Aunt   . Hyperlipidemia Paternal Uncle   . Cancer Paternal Uncle        prostate ca  . Breast  cancer Neg Hx     Review of Systems: A 12-system review of systems was performed and was negative except as noted in the HPI.  --------------------------------------------------------------------------------------------------  Physical Exam: BP 120/86 (BP Location: Right Arm, Patient Position: Sitting, Cuff Size: Normal)   Pulse 98   Ht 5' 1.5" (1.562 m)   Wt 190 lb 4 oz (86.3 kg)   SpO2 99%   BMI 35.37 kg/m   General:  NAD. HEENT: No conjunctival pallor or scleral icterus. Facemask in place. Neck: Supple without lymphadenopathy, thyromegaly, JVD, or HJR. No carotid bruit. Lungs: Normal work of breathing. Clear to auscultation bilaterally without wheezes or crackles. Heart: Regular rate and rhythm without murmurs, rubs, or gallops. Non-displaced PMI. Abd: Bowel sounds present. Soft, NT/ND without hepatosplenomegaly Ext: No lower extremity edema. Radial, PT, and DP pulses are 2+ bilaterally Skin: Warm and dry without rash. Neuro: CNIII-XII intact. Strength and fine-touch sensation intact in upper and lower extremities bilaterally. Psych: Normal mood and affect.  EKG:  Normal sinus rhythm with low voltage in precordial leads.  No significant abnormality.  Lab Results  Component Value Date   WBC 11.5 (H) 10/20/2019   HGB 12.9 10/20/2019   HCT 39.7 10/20/2019   MCV 68.7 Repeated and verified X2. (L) 10/20/2019   PLT 340.0 10/20/2019    Lab Results  Component Value Date   NA 139 05/07/2020   K 4.5 05/07/2020   CL 102 05/07/2020   CO2 29 05/07/2020   BUN 11 05/07/2020   CREATININE 0.82 05/07/2020   GLUCOSE 98 05/07/2020   ALT 11 05/07/2020    Lab Results  Component Value Date   CHOL 202 (H) 05/07/2020   HDL 48.80 05/07/2020   LDLCALC 118 (H) 05/07/2020   LDLDIRECT 112.5 12/30/2012   TRIG 176.0 (H) 05/07/2020   CHOLHDL 4 05/07/2020     --------------------------------------------------------------------------------------------------  ASSESSMENT AND  PLAN: Tachycardia: This has been present for >1 year and is largely asymptomatic.  Heart rate today is upper normal at rest (98 bpm), though Amy Lara notes that her heart rate can dip as low as the 40's at times.  She does not have any worrisome symptoms.  Physical examination is normal.  EKG shows normal sinus rhythm with borderline low voltage in the precordial leads but no significant abnormality.  I have recommended that we check a CBC, BMP, Mg, and TSH today, as well as place a 14-day event monitor.  If these studies are unrevealing, I think it is reasonable to defer additional testing.  I have encouraged Amy Lara to minimize her caffeine consumption as well as to continue exercising, as improving her cardiovascular conditioning should help lower her resting heart rate.  We will plan to continue metoprolol for now, though if her event monitor is unrevealing, I think it would be reasonable to stop this medication and work on lifestyle modifications to help improve her heart rate and blood pressure control.  Hypertension: Blood pressure upper normal today.  No medication changes are recommended at this time.  I encouraged Amy Lara to work on lifestyle modifications.  Follow-up: Return to clinic in 2 months.  Yvonne Kendall, MD 09/23/2020 8:49 AM

## 2020-09-23 ENCOUNTER — Ambulatory Visit (INDEPENDENT_AMBULATORY_CARE_PROVIDER_SITE_OTHER): Payer: BC Managed Care – PPO | Admitting: Internal Medicine

## 2020-09-23 ENCOUNTER — Encounter: Payer: Self-pay | Admitting: Internal Medicine

## 2020-09-23 ENCOUNTER — Other Ambulatory Visit: Payer: Self-pay

## 2020-09-23 VITALS — BP 120/86 | HR 98 | Ht 61.5 in | Wt 190.2 lb

## 2020-09-23 DIAGNOSIS — I10 Essential (primary) hypertension: Secondary | ICD-10-CM | POA: Diagnosis not present

## 2020-09-23 DIAGNOSIS — R000 Tachycardia, unspecified: Secondary | ICD-10-CM | POA: Diagnosis not present

## 2020-09-23 NOTE — Patient Instructions (Addendum)
Medication Instructions:  Your physician recommends that you continue on your current medications as directed. Please refer to the Current Medication list given to you today.  *If you need a refill on your cardiac medications before your next appointment, please call your pharmacy*  Lab Work: Your physician recommends that you return for lab work in: TODAY - CBC, TSH, BMP, Mg.  If you have labs (blood work) drawn today and your tests are completely normal, you will receive your results only by: Marland Kitchen MyChart Message (if you have MyChart) OR . A paper copy in the mail If you have any lab test that is abnormal or we need to change your treatment, we will call you to review the results.  Testing/Procedures:  ZIO MONITOR FOR 14 DAYS - MAIL TO PATIENT Your physician has recommended that you wear a Zio monitor. This monitor is a medical device that records the heart's electrical activity. Doctors most often use these monitors to diagnose arrhythmias. Arrhythmias are problems with the speed or rhythm of the heartbeat. The monitor is a small device applied to your chest. You can wear one while you do your normal daily activities. While wearing this monitor if you have any symptoms to push the button and record what you felt. Once you have worn this monitor for the period of time provider prescribed (Usually 14 days), you will return the monitor device in the postage paid box. Once it is returned they will download the data collected and provide [place] with a report which the provider will then review and we will call you with those results. Important tips:  1. Avoid showering during the first 24 hours of wearing the monitor. 2. Avoid excessive sweating to help maximize wear time. 3. Do not submerge the device, no hot tubs, and no swimming pools. 4. Keep any lotions or oils away from the patch. 5. After 24 hours you may shower with the patch on. Take brief showers with your back facing the shower head.  6. Do  not remove patch once it has been placed because that will interrupt data and decrease adhesive wear time. 7. Push the button when you have any symptoms and write down what you were feeling. 8. Once you have completed wearing your monitor, remove and place into box which has postage paid and place in your outgoing mailbox.  9. If for some reason you have misplaced your box then call our office and we can provide another box and/or mail it off for you.   Follow-Up: At [facility], you and your health needs are our priority.  As part of our continuing mission to provide you with exceptional heart care, we have created designated Provider Care Teams.  These Care Teams include your primary Cardiologist (physician) and Advanced Practice Providers (APPs -  Physician Assistants and Nurse Practitioners) who all work together to provide you with the care you need, when you need it.  We recommend signing up for the patient portal called "MyChart".  Sign up information is provided on this After Visit Summary.  MyChart is used to connect with patients for Virtual Visits (Telemedicine).  Patients are able to view lab/test results, encounter notes, upcoming appointments, etc.  Non-urgent messages can be sent to your provider as well.   To learn more about what you can do with MyChart, go to [email or web address].    Your next appointment:   2 month(s)  The format for your next appointment:   In Person  Provider:  You may see DR Cristal Deer END or one of the following Advanced Practice Providers on your designated Care Team:    Nicolasa Ducking, NP  Eula Listen, PA-C  Marisue Ivan, PA-C  Cadence Churchill, [place]  Gillian Shields, NP

## 2020-09-24 LAB — BASIC METABOLIC PANEL
BUN/Creatinine Ratio: 15 (ref 9–23)
BUN: 11 mg/dL (ref 6–24)
CO2: 19 mmol/L — ABNORMAL LOW (ref 20–29)
Calcium: 9.2 mg/dL (ref 8.7–10.2)
Chloride: 102 mmol/L (ref 96–106)
Creatinine, Ser: 0.71 mg/dL (ref 0.57–1.00)
Glucose: 96 mg/dL (ref 65–99)
Potassium: 4.6 mmol/L (ref 3.5–5.2)
Sodium: 140 mmol/L (ref 134–144)
eGFR: 107 mL/min/{1.73_m2} (ref >59)

## 2020-09-24 LAB — CBC
Hematocrit: 40.7 % (ref 34.0–46.6)
Hemoglobin: 13.1 g/dL (ref 11.1–15.9)
MCH: 22.2 pg — ABNORMAL LOW (ref 26.6–33.0)
MCHC: 32.2 g/dL (ref 31.5–35.7)
MCV: 69 fL — ABNORMAL LOW (ref 79–97)
Platelets: 282 10*3/uL (ref 150–450)
RBC: 5.9 x10E6/uL — ABNORMAL HIGH (ref 3.77–5.28)
RDW: 16.3 % — ABNORMAL HIGH (ref 11.7–15.4)
WBC: 8.2 10*3/uL (ref 3.4–10.8)

## 2020-09-24 LAB — TSH: TSH: 1.57 u[IU]/mL (ref 0.450–4.500)

## 2020-09-24 LAB — MAGNESIUM: Magnesium: 1.8 mg/dL (ref 1.6–2.3)

## 2020-09-27 ENCOUNTER — Ambulatory Visit (INDEPENDENT_AMBULATORY_CARE_PROVIDER_SITE_OTHER): Payer: BC Managed Care – PPO

## 2020-09-27 DIAGNOSIS — R000 Tachycardia, unspecified: Secondary | ICD-10-CM

## 2020-10-05 DIAGNOSIS — R000 Tachycardia, unspecified: Secondary | ICD-10-CM

## 2020-10-21 DIAGNOSIS — R000 Tachycardia, unspecified: Secondary | ICD-10-CM | POA: Diagnosis not present

## 2020-11-01 ENCOUNTER — Telehealth: Payer: Self-pay | Admitting: Internal Medicine

## 2020-11-01 ENCOUNTER — Other Ambulatory Visit: Payer: Self-pay

## 2020-11-01 ENCOUNTER — Ambulatory Visit (INDEPENDENT_AMBULATORY_CARE_PROVIDER_SITE_OTHER): Payer: BC Managed Care – PPO

## 2020-11-01 DIAGNOSIS — Z111 Encounter for screening for respiratory tuberculosis: Secondary | ICD-10-CM

## 2020-11-01 NOTE — Progress Notes (Signed)
Patient presented for PPD injection to left forearm, patient voiced no concerns nor showed any signs of distress during injection. 

## 2020-11-01 NOTE — Telephone Encounter (Signed)
Called to give the patient zio monitor results. Lmtcb.   Yvonne Kendall, MD  10/29/2020 3:54 PM EDT Back to Top     Please let Ms. Credeur know that her event monitor showed rare extra beats but no significant arrhythmia. Overall, her heart rate was relatively normal, averaging 85 bpm and often dropping into the 50's-70's when sleeping at night. She should follow-up as planned next month to reassess her symptoms.

## 2020-11-02 NOTE — Telephone Encounter (Signed)
2nd attempt to call results. No answer. Left message to call back.  Results released to My Chart.

## 2020-11-02 NOTE — Telephone Encounter (Signed)
Reviewed results and recommendations with patient and she verbalized understanding with no further questions at this time.  

## 2020-11-02 NOTE — Telephone Encounter (Signed)
Left voicemail message to call back  

## 2020-11-02 NOTE — Telephone Encounter (Signed)
Not in office today.

## 2020-11-03 ENCOUNTER — Other Ambulatory Visit: Payer: Self-pay

## 2020-11-03 ENCOUNTER — Ambulatory Visit: Payer: BC Managed Care – PPO

## 2020-11-03 DIAGNOSIS — Z111 Encounter for screening for respiratory tuberculosis: Secondary | ICD-10-CM

## 2020-11-03 LAB — TB SKIN TEST
Induration: 0 mm
TB Skin Test: NEGATIVE

## 2020-11-03 NOTE — Progress Notes (Signed)
Pt arrived today for a PPD read on the left forearm. Injection sight did not have any swelling or raised skin. Pt voiced no concern during waiting period with no reported itching or swelling. 

## 2020-11-24 ENCOUNTER — Ambulatory Visit: Payer: BC Managed Care – PPO | Admitting: Internal Medicine

## 2020-11-29 DIAGNOSIS — B078 Other viral warts: Secondary | ICD-10-CM | POA: Diagnosis not present

## 2020-12-01 NOTE — Progress Notes (Signed)
Office Visit    Patient Name: Amy Lara Date of Encounter: 12/02/2020  PCP:  Sherlene Shams, MD   [facility]  Cardiologist:  Yvonne Kendall, MD  Advanced Practice Provider:  No care team member to display Electrophysiologist:  None   Chief Complaint    Chief Complaint  Patient presents with  . Follow-up    2 Months follow up and has a question about Metoprolol. Medications verbally reviewed with patient.     45 y.o. female with history of tachycardia borderline hypertension, hyperlipidemia, asthma, GERD, and seen today for 67-month follow-up.  Past Medical History    Past Medical History:  Diagnosis Date  . Acquired pes planus of both feet   . Allergy   . Asthma   . Fibrocystic breast disease in female    no prior biopsy  . GERD (gastroesophageal reflux disease)   . Hyperlipidemia   . Hypertension   . Kidney stones   . Plantar fasciitis, bilateral   . Tachycardia   . Thalassemia carrier    Past Surgical History:  Procedure Laterality Date  . TUBAL LIGATION      Allergies  Allergies  Allergen Reactions  . Vaccinium Angustifolium Swelling    Mouth and scalp tingling with mouth swelling  . Latex   . Sulfonamide Derivatives     REACTION: hives    History of Present Illness    Amy Lara is a 45 y.o. female with PMH as above.  She has history of tachycardia, borderline hypertension, hyperlipidemia, asthma, and GERD.  She reportedly has had elevated heart rate for quite some time.  She was started on metoprolol by Dr. Darrick Huntsman approximately 1 year ago.  She was relatively asymptomatic, though sometimes noted racing heart rate when very active.  Cardiac monitoring showed rates from 40 bpm at rest to 180 bpm with activity.  She reported not feeling different since starting metoprolol.    When initially evaluated by her primary cardiologist 3/10 in clinic, she denied any symptoms other than sporadic episodes of PND.  She had been  diagnosed with reflux induced asthma and was using an albuterol inhaler, though she did not use it regularly.  She had not been evaluated for sleep apnea.  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    Labs were checked and 14-day event monitor placed.  Labs showed chronic microcytosis and otherwise no significant abnormality.  Cardiac monitoring showed NSR with rare PACs and PVCs and no significant arrhythmia.  Average heart rate 85 bpm with range 53 to 153 bpm.  When asleep, her heart rate was usually 50s to 70s bpm.  It was recommended she reduce caffeine intake and continue exercising if cardiac monitoring was unrevealing, lifestyle modifications were recommended.  Today, 12/02/2020, she presents to clinic and notes that she is feeling about the same since starting her beta-blocker.  She does note that others at work have noted her significant improvement since starting the beta-blocker, though she is uncertain what improvement they are noticing in her.  She denies any recent chest pain or shortness of breath at rest or with activity.  No presyncope or syncope.  No edema, orthopnea, weight gain, abdominal distention, PND, or early satiety.  She states that others around her have said she appears better since starting the medication, but she has not noticed any significant difference between being on and off the medication.  She is monitoring her heart rate on her watch  on occasion with rates reviewed today and typically well controlled when at rest with escalation into the low 100s with light activity and 130-150s with heavy exertion. She is working on overhaul in both her diet and her activity level.   She has lost 5 pounds since her previous visit, attributed to lifestyle changes.  She is escalating activity and is now running for the last 3 weeks without any chest pain or shortness of breath.  She occasionally feels a twinge on the right side that lasts only seconds and  is without clear triggers.  She also notes occasional orthostatic hypotension if standing too quickly.  Her heart rate was noted to be elevated today with patient report that she is just finished a workout and came straight to this visit.  Home Medications   Current Outpatient Medications  Medication Instructions  . diphenhydrAMINE HCl (BENADRYL PO) Oral, As needed  . famotidine (PEPCID) 10 mg, Oral, 2 times daily  . loratadine (CLARITIN) 10 mg, Oral, Daily PRN  . Melatonin 10 MG TABS 1 tablet, Oral, Daily PRN  . metoprolol succinate (TOPROL-XL) 25 mg, Oral, Daily  . omeprazole (PRILOSEC) 20 mg, Oral, As needed     Review of Systems    She denies chest pain, palpitations, dyspnea, pnd, orthopnea, n, v, dizziness, syncope, edema, weight gain, or early satiety.   All other systems reviewed and are otherwise negative except as noted above.  Physical Exam    VS:  BP 116/86 (BP Location: Left Arm, Patient Position: Sitting, Cuff Size: Normal)   Pulse (!) 102   Ht 5' 1.5" (1.562 m)   Wt 185 lb (83.9 kg)   SpO2 97%   BMI 34.39 kg/m  , BMI Body mass index is 34.39 kg/m. GEN: Well nourished, well developed, in no acute distress.  Facemask in place. HEENT: normal. Neck: Supple, no JVD, carotid bruits, or masses. Cardiac: Tachycardic but regular, no murmurs, rubs, or gallops. No clubbing, cyanosis, edema.  Radials/DP/PT 2+ and equal bilaterally.  Respiratory:  Respirations regular and unlabored, clear to auscultation bilaterally. GI: Soft, nontender, nondistended, BS + x 4. MS: no deformity or atrophy. Skin: warm and dry, no rash. Neuro:  Strength and sensation are intact. Psych: Normal affect.  Accessory Clinical Findings    ECG personally reviewed by me today -no EKG- no acute changes.  VITALS Reviewed today   Temp Readings from Last 3 Encounters:  09/06/20 97.6 F (36.4 C)  08/16/20 99 F (37.2 C) (Oral)  05/18/20 97.6 F (36.4 C) (Oral)   BP Readings from Last 3  Encounters:  12/02/20 116/86  09/23/20 120/86  09/06/20 136/90   Pulse Readings from Last 3 Encounters:  12/02/20 (!) 102  09/23/20 98  09/06/20 (!) 114    Wt Readings from Last 3 Encounters:  12/02/20 185 lb (83.9 kg)  09/23/20 190 lb 4 oz (86.3 kg)  09/06/20 191 lb 9.6 oz (86.9 kg)     LABS  reviewed today   Lab Results  Component Value Date   WBC 8.2 09/23/2020   HGB 13.1 09/23/2020   HCT 40.7 09/23/2020   MCV 69 (L) 09/23/2020   PLT 282 09/23/2020   Lab Results  Component Value Date   CREATININE 0.71 09/23/2020   BUN 11 09/23/2020   NA 140 09/23/2020   K 4.6 09/23/2020   CL 102 09/23/2020   CO2 19 (L) 09/23/2020   Lab Results  Component Value Date   ALT 11 05/07/2020   AST 12 05/07/2020  ALKPHOS 74 05/07/2020   BILITOT 0.3 05/07/2020   Lab Results  Component Value Date   CHOL 202 (H) 05/07/2020   HDL 48.80 05/07/2020   LDLCALC 118 (H) 05/07/2020   LDLDIRECT 112.5 12/30/2012   TRIG 176.0 (H) 05/07/2020   CHOLHDL 4 05/07/2020    Lab Results  Component Value Date   HGBA1C 5.9 (H) 05/07/2020   Lab Results  Component Value Date   TSH 1.570 09/23/2020     STUDIES/PROCEDURES reviewed today   Cardiac monitoring 09/27/2020  The patient was monitored for 11 days, 12 hours.  The predominant rhythm was sinus with an average rate of 85 bpm (range 53-153 bpm).  There were rare PAC's and PVC's.  No sustained arrhythmia or prolonged pause was observed.  There were no patient triggered events.   Predominantly sinus rhythm with rare PAC's and PVC's.  No significant arrhythmia.   Cardiovascular History & Procedures: Cardiovascular Problems:  Tachycardia  Risk Factors:  Hypertension, hyperlipidemia, and obesity  Cath/PCI:  None  CV Surgery:  None  EP Procedures and Devices:  None  Non-Invasive Evaluation(s):  None   Assessment & Plan     Tachycardia, PVCs, PACs - Tachycardia present for over a year with patient  asymptomatic.  She does occasionally note her heart rate elevates with minimal exertion into the low 100s, as well as dips into the 40s to 50s when sedentary.  Rate today is 102 bpm with consideration of her recent workout and hydration level, as well as the fact that she came straight to this appointment.  She continues to deny any worrisome symptoms.  As above, she notes that others have commented on her improvement since starting the metoprolol, though she is unsure of the reason they feel she overall seems improved on this medication.  Physical exam is without concerning findings.  Labs checked at prior visit with microcytosis noted but otherwise WNL.  14-day monitor as above with NSR, rare PACs/PVCs, and no significant arrhythmia.  Given the results of the studies, we will defer additional testing at this time.  We discussed deconditioning and the role that continued cardiovascular exercise with an ongoing regular workout routine will plan her rate control. Also discussed was weight loss and ongoing dietary changes.  She will continue to minimize her caffeine consumption and ensure hydration.  As previously noted, given her unrevealing work-up, it would be reasonable to discontinue metoprolol with plan for ongoing exercise/lifestyle changes; however, given her noted improvement by others, we will plan to continue her metoprolol for now and reassess at RTC or sooner if needed by patient.  She will continue to work on lifestyle modifications as discussed today to help improve her heart rate and BP control moving forward.  HTN --DBP borderline today, discussed with patient.  She will continue to work on lifestyle modifications with ongoing dietary changes and increases in activity as discussed above. DASH Diet discussed.  HLD -- States that PCP is following closely.  Continue to monitor labs per PCP.  Ongoing lifestyle modifications with dietary changes and increased activity again encouraged.  Medication  changes: None Labs ordered: None Studies / Imaging ordered: None Future considerations: Wean BB with improved rates Disposition: RTC 6-12 months   *Please be aware that the above documentation was completed voice recognition software and may contain dictation errors.      Lennon Alstrom, PA-C 12/02/2020

## 2020-12-02 ENCOUNTER — Encounter: Payer: Self-pay | Admitting: Physician Assistant

## 2020-12-02 ENCOUNTER — Other Ambulatory Visit: Payer: Self-pay

## 2020-12-02 ENCOUNTER — Ambulatory Visit (INDEPENDENT_AMBULATORY_CARE_PROVIDER_SITE_OTHER): Payer: BC Managed Care – PPO | Admitting: Physician Assistant

## 2020-12-02 VITALS — BP 116/86 | HR 102 | Ht 61.5 in | Wt 185.0 lb

## 2020-12-02 DIAGNOSIS — E785 Hyperlipidemia, unspecified: Secondary | ICD-10-CM

## 2020-12-02 DIAGNOSIS — R000 Tachycardia, unspecified: Secondary | ICD-10-CM | POA: Diagnosis not present

## 2020-12-02 DIAGNOSIS — R030 Elevated blood-pressure reading, without diagnosis of hypertension: Secondary | ICD-10-CM

## 2020-12-02 NOTE — Patient Instructions (Signed)
Medication Instructions:  Your physician recommends that you continue on your current medications as directed. Please refer to the Current Medication list given to you today.  *If you need a refill on your cardiac medications before your next appointment, please call your pharmacy*   Lab Work: None ordered   If you have labs (blood work) drawn today and your tests are completely normal, you will receive your results only by: Marland Kitchen MyChart Message (if you have MyChart) OR . A paper copy in the mail If you have any lab test that is abnormal or we need to change your treatment, we will call you to review the results.   Testing/Procedures: None ordered   Follow-Up: At [facility], you and your health needs are our priority.  As part of our continuing mission to provide you with exceptional heart care, we have created designated Provider Care Teams.  These Care Teams include your primary Cardiologist (physician) and Advanced Practice Providers (APPs -  Physician Assistants and Nurse Practitioners) who all work together to provide you with the care you need, when you need it.  We recommend signing up for the patient portal called "MyChart".  Sign up information is provided on this After Visit Summary.  MyChart is used to connect with patients for Virtual Visits (Telemedicine).  Patients are able to view lab/test results, encounter notes, upcoming appointments, etc.  Non-urgent messages can be sent to your provider as well.   To learn more about what you can do with MyChart, go to [email or web address].    Your next appointment:   6-12 month(s)  The format for your next appointment:   In Person  Provider:   Yvonne Kendall, MD

## 2020-12-20 ENCOUNTER — Other Ambulatory Visit: Payer: Self-pay

## 2020-12-20 ENCOUNTER — Ambulatory Visit (INDEPENDENT_AMBULATORY_CARE_PROVIDER_SITE_OTHER): Payer: BC Managed Care – PPO | Admitting: Podiatry

## 2020-12-20 DIAGNOSIS — M722 Plantar fascial fibromatosis: Secondary | ICD-10-CM

## 2020-12-20 DIAGNOSIS — M67471 Ganglion, right ankle and foot: Secondary | ICD-10-CM

## 2020-12-20 MED ORDER — TRIAMCINOLONE ACETONIDE 40 MG/ML IJ SUSP
20.0000 mg | Freq: Once | INTRAMUSCULAR | Status: AC
  Administered 2020-12-20: 20 mg

## 2020-12-20 NOTE — Progress Notes (Signed)
Subjective:  Patient ID: Amy Lara, female    DOB: 27-Mar-1976,  MRN: [id number] HPI No chief complaint on file.   45 y.o. female presents with the above complaint.   ROS: Denies fever chills nausea vomiting muscle aches pains calf pain back pain chest pain shortness of breath.  Past Medical History:  Diagnosis Date  . Acquired pes planus of both feet   . Allergy   . Asthma   . Fibrocystic breast disease in female    no prior biopsy  . GERD (gastroesophageal reflux disease)   . Hyperlipidemia   . Hypertension   . Kidney stones   . Plantar fasciitis, bilateral   . Tachycardia   . Thalassemia carrier    Past Surgical History:  Procedure Laterality Date  . TUBAL LIGATION      Current Outpatient Medications:  .  diphenhydrAMINE HCl (BENADRYL PO), Take by mouth as needed., Disp: , Rfl:  .  famotidine (PEPCID) 10 MG tablet, Take 10 mg by mouth 2 (two) times daily., Disp: , Rfl:  .  loratadine (CLARITIN) 10 MG tablet, Take 10 mg by mouth daily as needed., Disp: , Rfl:  .  Melatonin 10 MG TABS, Take 1 tablet by mouth daily as needed., Disp: , Rfl:  .  metoprolol succinate (TOPROL-XL) 25 MG 24 hr tablet, Take 1 tablet (25 mg total) by mouth daily., Disp: 90 tablet, Rfl: 3 .  omeprazole (PRILOSEC) 20 MG capsule, Take 20 mg by mouth as needed., Disp: , Rfl:   Allergies  Allergen Reactions  . Vaccinium Angustifolium Swelling    Mouth and scalp tingling with mouth swelling  . Latex   . Sulfonamide Derivatives     REACTION: hives   Review of Systems Objective:  There were no vitals filed for this visit.  General: Well developed, nourished, in no acute distress, alert and oriented x3   Dermatological: Skin is warm, dry and supple bilateral. Nails x 10 are well maintained; remaining integument appears unremarkable at this time. There are no open sores, no preulcerative lesions, no rash or signs of infection present.  Vascular: Dorsalis Pedis artery and Posterior Tibial  artery pedal pulses are 2/4 bilateral with immedate capillary fill time. Pedal hair growth present. No varicosities and no lower extremity edema present bilateral.   Neruologic: Grossly intact via light touch bilateral. Vibratory intact via tuning fork bilateral. Protective threshold with Semmes Wienstein monofilament intact to all pedal sites bilateral. Patellar and Achilles deep tendon reflexes 2+ bilateral. No Babinski or clonus noted bilateral.   Musculoskeletal: No gross boney pedal deformities bilateral. No pain, crepitus, or limitation noted with foot and ankle range of motion bilateral. Muscular strength 5/5 in all groups tested bilateral.  She has a palpable firm nonpulsatile mass just proximal to the sinus tarsi and inferior to the lateral malleolus.  It appears to be slightly mobile nonpulsatile so it appears to be more of a ganglion.  He could also be associated with the peroneal tendons in this area as well as the sinus tarsi.  She still has pain on palpation MucoClear tubercle of the left heel.  Gait: Unassisted, Nonantalgic.    Radiographs:  None taken  Assessment & Plan:   Assessment: Ganglion cyst right ankle.  Chronic proximal Planter fasciitis left foot.  Plan: Discussed appropriate shoe gear stretching exercise ice therapy sugar modifications.  Discussed surgical intervention or aspiration.  At this point we will leave the ganglion alone unless it becomes more painful or interferes with her exercise  routine or her ability perform her daily activities.  I injected her left heel today 20 mg Kenalog 5 mg Marcaine point maximal tenderness.  She tolerated procedure well without complications.  I will follow-up with her on an as-needed basis.     Jaymie Misch T. [place], [place]

## 2021-02-08 ENCOUNTER — Ambulatory Visit (INDEPENDENT_AMBULATORY_CARE_PROVIDER_SITE_OTHER): Payer: BC Managed Care – PPO | Admitting: Family

## 2021-02-08 ENCOUNTER — Other Ambulatory Visit: Payer: Self-pay

## 2021-02-08 ENCOUNTER — Encounter: Payer: Self-pay | Admitting: Family

## 2021-02-08 VITALS — BP 148/92 | HR 103 | Temp 98.1°F | Ht 61.5 in | Wt 190.8 lb

## 2021-02-08 DIAGNOSIS — R6889 Other general symptoms and signs: Secondary | ICD-10-CM

## 2021-02-08 DIAGNOSIS — K2100 Gastro-esophageal reflux disease with esophagitis, without bleeding: Secondary | ICD-10-CM

## 2021-02-08 DIAGNOSIS — N912 Amenorrhea, unspecified: Secondary | ICD-10-CM

## 2021-02-08 LAB — CBC WITH DIFFERENTIAL/PLATELET
Basophils Absolute: 0 10*3/uL (ref 0.0–0.1)
Basophils Relative: 0.5 % (ref 0.0–3.0)
Eosinophils Absolute: 0.2 10*3/uL (ref 0.0–0.7)
Eosinophils Relative: 2.3 % (ref 0.0–5.0)
HCT: 39.4 % (ref 36.0–46.0)
Hemoglobin: 12.5 g/dL (ref 12.0–15.0)
Lymphocytes Relative: 29.1 % (ref 12.0–46.0)
Lymphs Abs: 2.3 10*3/uL (ref 0.7–4.0)
MCHC: 31.6 g/dL (ref 30.0–36.0)
MCV: 67.8 fl — ABNORMAL LOW (ref 78.0–100.0)
Monocytes Absolute: 0.5 10*3/uL (ref 0.1–1.0)
Monocytes Relative: 6.7 % (ref 3.0–12.0)
Neutro Abs: 4.9 10*3/uL (ref 1.4–7.7)
Neutrophils Relative %: 61.4 % (ref 43.0–77.0)
Platelets: 312 10*3/uL (ref 150.0–400.0)
RBC: 5.81 Mil/uL — ABNORMAL HIGH (ref 3.87–5.11)
RDW: 15.7 % — ABNORMAL HIGH (ref 11.5–15.5)
WBC: 8 10*3/uL (ref 4.0–10.5)

## 2021-02-08 MED ORDER — OMEPRAZOLE 40 MG PO CPDR: 40.0000 mg | DELAYED_RELEASE_CAPSULE | Freq: Every day | ORAL | 3 refills | Status: DC

## 2021-02-09 LAB — FOLLICLE STIMULATING HORMONE: FSH: 26.5 m[IU]/mL

## 2021-02-09 LAB — LUTEINIZING HORMONE: LH: 38.2 m[IU]/mL

## 2021-02-09 NOTE — Progress Notes (Signed)
Acute Office Visit  Subjective:    Patient ID: Amy Lara, female    DOB: Nov 26, 1975, 45 y.o.   MRN: [id number]  Chief Complaint  Patient presents with  . Chest Pain    Pt states she has pain that starts in the middle of her breast and radiates towards the right side of her ribs. Pt states she has been feeling a bit of pain her her chest today. Pain started a couple of weeks ago.    HPI Patient is in today with c/o abdominal pain in th epigastric region x 3 weeks that comes and goes. She takes Omeprazole 20 mg and Pepcid daily. Pain today is 3/10. Reports feeling like her esophagus is tightening up at times. Also reports increased burping and flatulence. Admits to fried and spicy foods. Pain is improved with eating. Patient is able to pinpoint and reproduce the pain. 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 She is concerned about her heart. She had a holter monitor performed in May that was normal. Admits be being overly cautious as she is learning about various disease processes in school. Has a history of ulcers in the past.   Patient also reports having difficulty losing weight. Reports eating better and exercising. Last menstrual period was in April. No pregnancy concerns. Family history of menopause in the 40's.  Past Medical History:  Diagnosis Date  . Acquired pes planus of both feet   . Allergy   . Asthma   . Fibrocystic breast disease in female    no prior biopsy  . GERD (gastroesophageal reflux disease)   . Hyperlipidemia   . Hypertension   . Kidney stones   . Plantar fasciitis, bilateral   . Tachycardia   . Thalassemia carrier     Past Surgical History:  Procedure Laterality Date  . TUBAL LIGATION      Family History  Problem Relation Age of Onset  . Hyperlipidemia Mother   . Atrial fibrillation Mother 59  . Cancer Father        lung cancer died 09-27-2008  . Hyperlipidemia Father   . Heart disease Paternal Grandmother   . Hyperlipidemia Paternal  Grandfather   . Hyperlipidemia Paternal Aunt   . Heart attack Paternal Aunt   . Hyperlipidemia Paternal Uncle   . Cancer Paternal Uncle        prostate ca  . Heart attack Paternal Uncle   . Breast cancer Neg Hx     Social History   Socioeconomic History  . Marital status: Married    Spouse name: Not on file  . Number of children: Not on file  . Years of education: Not on file  . Highest education level: Not on file  Occupational History  . Not on file  Tobacco Use  . Smoking status: Never  . Smokeless tobacco: Never  Vaping Use  . Vaping Use: Never used  Substance and Sexual Activity  . Alcohol use: Yes    Comment: occasioally (once a month)  . Drug use: No  . Sexual activity: Not on file  Other Topics Concern  . Not on file  Social History Narrative  . Not on file   Social Determinants of Health   Financial Resource Strain: Not on file  Food Insecurity: Not on file  Transportation Needs: Not on file  Physical Activity: Not on file  Stress: Not on file  Social Connections: Not on file  Intimate Partner Violence: Not on file    Outpatient  Medications Prior to Visit  Medication Sig Dispense Refill  . diphenhydrAMINE HCl (BENADRYL PO) Take by mouth as needed.    . famotidine (PEPCID) 10 MG tablet Take 10 mg by mouth 2 (two) times daily.    Marland Kitchen loratadine (CLARITIN) 10 MG tablet Take 10 mg by mouth daily as needed.    . Melatonin 10 MG TABS Take 1 tablet by mouth daily as needed.    . metoprolol succinate (TOPROL-XL) 25 MG 24 hr tablet Take 1 tablet (25 mg total) by mouth daily. 90 tablet 3  . omeprazole (PRILOSEC) 20 MG capsule Take 20 mg by mouth as needed.     No facility-administered medications prior to visit.    Allergies  Allergen Reactions  . Vaccinium Angustifolium Swelling    Mouth and scalp tingling with mouth swelling  . Latex   . Sulfonamide Derivatives     REACTION: hives    Review of Systems  Constitutional: Negative.   Respiratory:  Negative.    Cardiovascular: Negative.   Gastrointestinal:  Positive for abdominal pain. Negative for blood in stool and vomiting.  Endocrine: Positive for heat intolerance.  Genitourinary:  Positive for menstrual problem.  Musculoskeletal: Negative.   Skin: Negative.   Allergic/Immunologic: Negative.   Neurological: Negative.   Psychiatric/Behavioral: Negative.    All other systems reviewed and are negative.     Objective:    Physical Exam Vitals and nursing note reviewed.  Constitutional:      Appearance: She is well-developed.  Cardiovascular:     Rate and Rhythm: Normal rate and regular rhythm.     Heart sounds: Normal heart sounds.  Pulmonary:     Effort: Pulmonary effort is normal.     Breath sounds: Normal breath sounds.  Abdominal:     General: Bowel sounds are normal.     Palpations: Abdomen is soft.     Tenderness: There is no guarding or rebound.     Comments: Epigastric pain to palpation  Musculoskeletal:        General: Normal range of motion.     Cervical back: Normal range of motion and neck supple.  Skin:    General: Skin is warm and dry.  Neurological:     General: No focal deficit present.     Mental Status: She is alert.   BP (!) 148/92   Pulse (!) 103   Temp 98.1 F (36.7 C)   Ht 5' 1.5" (1.562 m)   Wt 190 lb 12.8 oz (86.5 kg)   SpO2 98%   BMI 35.47 kg/m  Wt Readings from Last 3 Encounters:  02/08/21 190 lb 12.8 oz (86.5 kg)  12/02/20 185 lb (83.9 kg)  09/23/20 190 lb 4 oz (86.3 kg)    Health Maintenance Due  Topic Date Due  . Pneumococcal Vaccine 71-21 Years old (1 - PCV) Never done  . HIV Screening  Never done  . Hepatitis C Screening  Never done  . COVID-19 Vaccine (3 - Booster for Pfizer series) 05/17/2020  . COLONOSCOPY (Pts 45-32yr Insurance coverage will need to be confirmed)  Never done    There are no preventive care reminders to display for this patient.   Lab Results  Component Value Date   TSH 1.570 09/23/2020   Lab  Results  Component Value Date   WBC 8.0 02/08/2021   HGB 12.5 02/08/2021   HCT 39.4 02/08/2021   MCV 67.8 Repeated and verified X2. (L) 02/08/2021   PLT 312.0 02/08/2021   Lab  Results  Component Value Date   NA 140 09/23/2020   K 4.6 09/23/2020   CO2 19 (L) 09/23/2020   GLUCOSE 96 09/23/2020   BUN 11 09/23/2020   CREATININE 0.71 09/23/2020   BILITOT 0.3 05/07/2020   ALKPHOS 74 05/07/2020   AST 12 05/07/2020   ALT 11 05/07/2020   PROT 7.2 05/07/2020   ALBUMIN 4.0 05/07/2020   CALCIUM 9.2 09/23/2020   EGFR 107 09/23/2020   GFR 86.90 05/07/2020   Lab Results  Component Value Date   CHOL 202 (H) 05/07/2020   Lab Results  Component Value Date   HDL 48.80 05/07/2020   Lab Results  Component Value Date   LDLCALC 118 (H) 05/07/2020   Lab Results  Component Value Date   TRIG 176.0 (H) 05/07/2020   Lab Results  Component Value Date   CHOLHDL 4 05/07/2020   Lab Results  Component Value Date   HGBA1C 5.9 (H) 05/07/2020       Assessment & Plan:   Problem List Items Addressed This Visit   None Visit Diagnoses     Gastroesophageal reflux disease with esophagitis without hemorrhage    -  Primary   Relevant Orders   H. pylori breath test (Completed)   CBC w/Diff (Completed)   Heat intolerance       Relevant Orders   Thyroid Panel With TSH   CBC w/Diff (Completed)   FSH (Completed)   LH (Completed)   Amenorrhea       Relevant Orders   FSH (Completed)   LH (Completed)        Meds ordered this encounter  Medications  . omeprazole (PRILOSEC) 40 MG capsule    Sig: Take 1 capsule (40 mg total) by mouth daily.    Dispense:  30 capsule    Refill:  3   Plan: Increase Omeprazole to 40 mg daily. Reduce intake of spicy and fried foods. Reassured patient that she is not having cardiac related pain as she is able to pinpoint and reproduce the pain. Incorporate alkaline water into the diet. Call the office if symptoms worsen or persist. Recheck as scheduled and as  needed.   Kennyth Arnold, FNP

## 2021-02-10 LAB — H. PYLORI BREATH TEST: H. pylori Breath Test: NOT DETECTED

## 2021-02-10 LAB — THYROID PANEL WITH TSH

## 2021-03-29 DIAGNOSIS — H5213 Myopia, bilateral: Secondary | ICD-10-CM | POA: Diagnosis not present

## 2021-03-29 DIAGNOSIS — H43313 Vitreous membranes and strands, bilateral: Secondary | ICD-10-CM | POA: Diagnosis not present

## 2021-04-11 ENCOUNTER — Ambulatory Visit (INDEPENDENT_AMBULATORY_CARE_PROVIDER_SITE_OTHER): Payer: BC Managed Care – PPO | Admitting: Internal Medicine

## 2021-04-11 ENCOUNTER — Other Ambulatory Visit: Payer: Self-pay

## 2021-04-11 ENCOUNTER — Other Ambulatory Visit: Payer: Self-pay | Admitting: Internal Medicine

## 2021-04-11 ENCOUNTER — Encounter: Payer: Self-pay | Admitting: Internal Medicine

## 2021-04-11 DIAGNOSIS — E669 Obesity, unspecified: Secondary | ICD-10-CM | POA: Diagnosis not present

## 2021-04-11 DIAGNOSIS — R079 Chest pain, unspecified: Secondary | ICD-10-CM

## 2021-04-11 DIAGNOSIS — R9389 Abnormal findings on diagnostic imaging of other specified body structures: Secondary | ICD-10-CM | POA: Diagnosis not present

## 2021-04-11 DIAGNOSIS — E785 Hyperlipidemia, unspecified: Secondary | ICD-10-CM

## 2021-04-11 DIAGNOSIS — I10 Essential (primary) hypertension: Secondary | ICD-10-CM | POA: Diagnosis not present

## 2021-04-11 DIAGNOSIS — K219 Gastro-esophageal reflux disease without esophagitis: Secondary | ICD-10-CM

## 2021-04-11 MED ORDER — TIRZEPATIDE 2.5 MG/0.5ML ~~LOC~~ SOAJ: 2.5000 mg | SUBCUTANEOUS | 1 refills | Status: DC

## 2021-04-11 NOTE — Progress Notes (Signed)
Subjective:  Patient ID: Amy Lara, female    DOB: [birth year]  Age: 45 y.o. MRN: [id number]  CC: Diagnoses of Obesity (BMI 30.0-34.9), Abnormal chest x-ray, Essential hypertension, Hyperlipidemia with target LDL less than 160, and Chest pain due to GERD were pertinent to this visit.  HPI Amy Lara presents for follow up on obesity Chief Complaint  Patient presents with   Weight loss medicatoin    Pt wants to discuss getting a weight loss medication to help control/suppress her appetite.    This visit occurred during the SARS-CoV-2 public health emergency.  Safety protocols were in place, including screening questions prior to the visit, additional usage of staff PPE, and extensive cleaning of exam room while observing appropriate contact time as indicated for disinfecting solutions.   Amy Lara is a 45 yr old female with a history of SVT managed with metoprolol  and thalassemia minor who presents today with a request for pharmacologic options for weight management.  She has been struggling to lose weight for  the past several years on her own using diet and exercise.  She is a full time nursing student currently.     She was last seen by me in February for follow up on an abnormal chest x ray noted the previous week by Urgent Care for workup of a persistent cough. After treatment with [facility] for lobar pneumonia repeat chest x ray Feb 21 was normal.   In the interim,  her mother in law died [name]) in 10-25-20 due to hypovolemic shock from ongoing blood loss from metastatic endometrial CA .  She was treated for left heel pain secondary to plantar fasciitis by podiatry in June with  a triamcinolone heel injection  She was treated for chest pain attributed to GERD by NP Worthy Rancher in July and took  omeprazole 40  mg daily x 14 days, along with famotidine 10 mg qhs  . The chest pain has resolved.   She reports increased appetite and decreased participation in daily exercise  as contributors to her weight problem.    Discussed personal and family history; no thyroid CA .  No personal history of pancreatitis.  Reviewed labs, specifically a1c 's suggesting at risk for diabetes. Discussed use of Mounjaro     Outpatient Medications Prior to Visit  Medication Sig Dispense Refill   diphenhydrAMINE HCl (BENADRYL PO) Take by mouth as needed.     famotidine (PEPCID) 10 MG tablet Take 10 mg by mouth 2 (two) times daily.     Melatonin 10 MG TABS Take 1 tablet by mouth daily as needed.     metoprolol succinate (TOPROL-XL) 25 MG 24 hr tablet Take 1 tablet (25 mg total) by mouth daily. 90 tablet 3   omeprazole (PRILOSEC) 40 MG capsule Take 1 capsule (40 mg total) by mouth daily. 30 capsule 3   loratadine (CLARITIN) 10 MG tablet Take 10 mg by mouth daily as needed. (Patient not taking: Reported on 04/11/2021)     No facility-administered medications prior to visit.    Review of Systems;  Patient denies headache, fevers, malaise, unintentional weight loss, skin rash, eye pain, sinus congestion and sinus pain, sore throat, dysphagia,  hemoptysis , cough, dyspnea, wheezing, chest pain, palpitations, orthopnea, edema, abdominal pain, nausea, melena, diarrhea, constipation, flank pain, dysuria, hematuria, urinary  Frequency, nocturia, numbness, tingling, seizures,  Focal weakness, Loss of consciousness,  Tremor, insomnia, depression, anxiety, and suicidal ideation.      Objective:  BP 130/82  Pulse 100   Temp 98.1 F (36.7 C)   Ht 5' 1.5" (1.562 m)   Wt 187 lb 3.2 oz (84.9 kg)   SpO2 99%   BMI 34.80 kg/m   BP Readings from Last 3 Encounters:  04/11/21 130/82  02/08/21 (!) 148/92  12/02/20 116/86    Wt Readings from Last 3 Encounters:  04/11/21 187 lb 3.2 oz (84.9 kg)  02/08/21 190 lb 12.8 oz (86.5 kg)  12/02/20 185 lb (83.9 kg)    General appearance: alert, cooperative and appears stated age Ears: normal TM's and external ear canals both ears Throat: lips,  mucosa, and tongue normal; teeth and gums normal Neck: no adenopathy, no carotid bruit, supple, symmetrical, trachea midline and thyroid not enlarged, symmetric, no tenderness/mass/nodules Back: symmetric, no curvature. ROM normal. No CVA tenderness. Lungs: clear to auscultation bilaterally Heart: regular rate and rhythm, S1, S2 normal, no murmur, click, rub or gallop Abdomen: soft, non-tender; bowel sounds normal; no masses,  no organomegaly Pulses: 2+ and symmetric Skin: Skin color, texture, turgor normal. No rashes or lesions Lymph nodes: Cervical, supraclavicular, and axillary nodes normal.  Lab Results  Component Value Date   HGBA1C 5.9 (H) 05/07/2020   HGBA1C 5.7 (H) 05/12/2019   HGBA1C 5.7 (H) 05/13/2018    Lab Results  Component Value Date   CREATININE 0.71 09/23/2020   CREATININE 0.82 05/07/2020   CREATININE 0.98 10/20/2019    Lab Results  Component Value Date   WBC 8.0 02/08/2021   HGB 12.5 02/08/2021   HCT 39.4 02/08/2021   PLT 312.0 02/08/2021   GLUCOSE 96 09/23/2020   CHOL 202 (H) 05/07/2020   TRIG 176.0 (H) 05/07/2020   HDL 48.80 05/07/2020   LDLDIRECT 112.5 12/30/2012   LDLCALC 118 (H) 05/07/2020   ALT 11 05/07/2020   AST 12 05/07/2020   NA 140 09/23/2020   K 4.6 09/23/2020   CL 102 09/23/2020   CREATININE 0.71 09/23/2020   BUN 11 09/23/2020   CO2 19 (L) 09/23/2020   TSH CANCELED 02/08/2021   HGBA1C 5.9 (H) 05/07/2020   MICROALBUR <0.7 10/20/2019    DG Chest 2 View  Result Date: 08/16/2020 CLINICAL DATA:  Cough. Cough since pain COVID positive 07/15/2020 lightheadedness. EXAM: CHEST - 2 VIEW COMPARISON:  10/29/2019 FINDINGS: The cardiomediastinal contours are normal. Subsegmental opacity in the right mid lung. Pulmonary vasculature is normal. No confluent consolidation, pleural effusion, or pneumothorax. No acute osseous abnormalities are seen. IMPRESSION: Subsegmental opacity in the right mid lung, may represent atelectasis or minimal pneumonia in  the setting of COVID 19. Electronically Signed   By: Narda Rutherford M.D.   On: 08/16/2020 17:32    Assessment & Plan:   Problem List Items Addressed This Visit       Unprioritized   Abnormal chest x-ray    Resolved with treatment of lobar pneumonia  Feb 2022       Chest pain due to GERD    Presumed, since her pain transiently  resolved with use of PPI  Discussed current controversy regarding prolonged use of PPI in patients without documented Barretts esophagus.  Suggested she repeat 14 day trial of PPI,  followed by use of famotidine  20 mg twice daily.  If GERD symptoms return,  advised her to accept referral for EGD.       Essential hypertension    Diagnosed in the setting of daily use of metoprolol for tachycardia. No changes today      Hyperlipidemia with target LDL  less than 160    Mild, with LDL < 130  By last check. No indication for treatment .. repeat inn 3 months after treatment initiation with mounjaro for obesity management.  Lab Results  Component Value Date   CHOL 202 (H) 05/07/2020   HDL 48.80 05/07/2020   LDLCALC 118 (H) 05/07/2020   LDLDIRECT 112.5 12/30/2012   TRIG 176.0 (H) 05/07/2020   CHOLHDL 4 05/07/2020         Obesity (BMI 30.0-34.9)    She has been unable to lose weight  On her own.   Given her a1c of 5.9 I have recommended that she initiate a trial of Mounjaro,  For appetite suppression,  adhere to a  low GI diet and continue participation in  regular exercise a minimum of 5 days per week.  Follow up 3 months   Lab Results  Component Value Date   HGBA1C 5.9 (H) 05/07/2020   Lab Results  Component Value Date   ALT 11 05/07/2020   AST 12 05/07/2020   ALKPHOS 74 05/07/2020   BILITOT 0.3 05/07/2020   Lab Results  Component Value Date   TSH CANCELED 02/08/2021           Meds ordered this encounter  Medications   DISCONTD: tirzepatide (MOUNJARO) 2.5 MG/0.5ML Pen    Sig: Inject 2.5 mg into the skin once a week.    Dispense:  2 mL     Refill:  1   I provided  30 minutes of  face-to-face time during this encounter reviewing patient's current problems and past surgeries, labs and imaging studies, providing counseling on the above mentioned problems , and coordination  of care .    There are no discontinued medications.  Follow-up: No follow-ups on file.   Sherlene Shams, MD

## 2021-04-11 NOTE — Patient Instructions (Addendum)
For the reflux:  Resume 14 days of omeprazole 40 mg daily plus  famotidine 20 mg at bedtime  After 14 days,  add famotidine 20 mg daily in the am (along wit the evening dose ) and stop the omeprazole.   If not controlled,  we will talk about GI consult .    For the weight management:  I recommend a medication to help you lose weight called Mounjaro. You no longer have to go through [facility] to receive the medication   You can read about it and obtain the $25 copay card on their website: [email or web address].[email or web address]  Greggory Keen is a medication that is taken as a weekly subcutaneous injection. It is not insulin.  It  causes your pancreas to increase its  own insulin secretion  And also slows down the emptying of your stomach,  So it decreases your appetite and helps you lose weight.  The dose for the first 4 weekly doses is 2.5 mg .  You may have mild nausea on the first or second day but this should resolve.  If not  ,  stop the medication.   As long as you are losing weight,  you can continue the dose you are on .  Only increase the dose to  5.0 mg  after 4 weeks if your weight has plateaued.  Let me know when you need a refill and what dose you are taking.

## 2021-04-12 DIAGNOSIS — K219 Gastro-esophageal reflux disease without esophagitis: Secondary | ICD-10-CM | POA: Insufficient documentation

## 2021-04-12 DIAGNOSIS — R079 Chest pain, unspecified: Secondary | ICD-10-CM | POA: Insufficient documentation

## 2021-04-12 NOTE — Assessment & Plan Note (Addendum)
She has been unable to lose weight  On her own.   Given her a1c of 5.9 I have recommended that she initiate a trial of Mounjaro,  For appetite suppression,  adhere to a  low GI diet and continue participation in  regular exercise a minimum of 5 days per week.  Follow up 3 months   Lab Results  Component Value Date   HGBA1C 5.9 (H) 05/07/2020   Lab Results  Component Value Date   ALT 11 05/07/2020   AST 12 05/07/2020   ALKPHOS 74 05/07/2020   BILITOT 0.3 05/07/2020   Lab Results  Component Value Date   TSH CANCELED 02/08/2021

## 2021-04-12 NOTE — Assessment & Plan Note (Addendum)
Presumed, since her pain transiently  resolved with use of PPI  Discussed current controversy regarding prolonged use of PPI in patients without documented Barretts esophagus.  Suggested she repeat 14 day trial of PPI,  followed by use of famotidine  20 mg twice daily.  If GERD symptoms return,  advised her to accept referral for EGD.

## 2021-04-12 NOTE — Assessment & Plan Note (Signed)
Mild, with LDL < 130  By last check. No indication for treatment .. repeat inn 3 months after treatment initiation with mounjaro for obesity management.  Lab Results  Component Value Date   CHOL 202 (H) 05/07/2020   HDL 48.80 05/07/2020   LDLCALC 118 (H) 05/07/2020   LDLDIRECT 112.5 12/30/2012   TRIG 176.0 (H) 05/07/2020   CHOLHDL 4 05/07/2020

## 2021-04-12 NOTE — Assessment & Plan Note (Signed)
Resolved with treatment of lobar pneumonia  Feb 2022

## 2021-04-12 NOTE — Assessment & Plan Note (Signed)
Diagnosed in the setting of daily use of metoprolol for tachycardia. No changes today

## 2021-05-18 ENCOUNTER — Ambulatory Visit (INDEPENDENT_AMBULATORY_CARE_PROVIDER_SITE_OTHER): Payer: BC Managed Care – PPO | Admitting: Internal Medicine

## 2021-05-18 ENCOUNTER — Encounter: Payer: Self-pay | Admitting: Internal Medicine

## 2021-05-18 ENCOUNTER — Other Ambulatory Visit (HOSPITAL_COMMUNITY)
Admission: RE | Admit: 2021-05-18 | Discharge: 2021-05-18 | Disposition: A | Discharge status: Home / Self Care | Payer: BC Managed Care – PPO | Source: Ambulatory Visit | Attending: Internal Medicine | Admitting: Internal Medicine

## 2021-05-18 ENCOUNTER — Other Ambulatory Visit: Payer: BC Managed Care – PPO

## 2021-05-18 ENCOUNTER — Other Ambulatory Visit: Payer: Self-pay

## 2021-05-18 VITALS — BP 114/66 | HR 85 | Temp 96.0°F | Ht 61.5 in | Wt 172.2 lb

## 2021-05-18 DIAGNOSIS — E785 Hyperlipidemia, unspecified: Secondary | ICD-10-CM | POA: Diagnosis not present

## 2021-05-18 DIAGNOSIS — Z124 Encounter for screening for malignant neoplasm of cervix: Secondary | ICD-10-CM | POA: Diagnosis not present

## 2021-05-18 DIAGNOSIS — Z1231 Encounter for screening mammogram for malignant neoplasm of breast: Secondary | ICD-10-CM

## 2021-05-18 DIAGNOSIS — M76822 Posterior tibial tendinitis, left leg: Secondary | ICD-10-CM

## 2021-05-18 DIAGNOSIS — Z23 Encounter for immunization: Secondary | ICD-10-CM | POA: Diagnosis not present

## 2021-05-18 DIAGNOSIS — R7303 Prediabetes: Secondary | ICD-10-CM

## 2021-05-18 DIAGNOSIS — Z0000 Encounter for general adult medical examination without abnormal findings: Secondary | ICD-10-CM | POA: Diagnosis not present

## 2021-05-18 DIAGNOSIS — R000 Tachycardia, unspecified: Secondary | ICD-10-CM

## 2021-05-18 DIAGNOSIS — I10 Essential (primary) hypertension: Secondary | ICD-10-CM | POA: Diagnosis not present

## 2021-05-18 DIAGNOSIS — R87810 Cervical high risk human papillomavirus (HPV) DNA test positive: Secondary | ICD-10-CM

## 2021-05-18 DIAGNOSIS — E669 Obesity, unspecified: Secondary | ICD-10-CM

## 2021-05-18 DIAGNOSIS — M76829 Posterior tibial tendinitis, unspecified leg: Secondary | ICD-10-CM | POA: Insufficient documentation

## 2021-05-18 LAB — COMPREHENSIVE METABOLIC PANEL
ALT: 12 U/L (ref 0–35)
AST: 14 U/L (ref 0–37)
Albumin: 4.2 g/dL (ref 3.5–5.2)
Alkaline Phosphatase: 61 U/L (ref 39–117)
BUN: 8 mg/dL (ref 6–23)
CO2: 27 mEq/L (ref 19–32)
Calcium: 9 mg/dL (ref 8.4–10.5)
Chloride: 104 mEq/L (ref 96–112)
Creatinine, Ser: 0.8 mg/dL (ref 0.40–1.20)
GFR: 88.87 mL/min (ref >60.00)
Glucose, Bld: 85 mg/dL (ref 70–99)
Potassium: 4.4 mEq/L (ref 3.5–5.1)
Sodium: 139 mEq/L (ref 135–145)
Total Bilirubin: 0.4 mg/dL (ref 0.2–1.2)
Total Protein: 7 g/dL (ref 6.0–8.3)

## 2021-05-18 LAB — LIPID PANEL
Cholesterol: 183 mg/dL (ref 0–200)
HDL: 41.5 mg/dL (ref >39.00)
LDL Cholesterol: 125 mg/dL — ABNORMAL HIGH (ref 0–99)
NonHDL: 141.11
Total CHOL/HDL Ratio: 4
Triglycerides: 82 mg/dL (ref 0.0–149.0)
VLDL: 16.4 mg/dL (ref 0.0–40.0)

## 2021-05-18 MED ORDER — MOUNJARO 2.5 MG/0.5ML ~~LOC~~ SOAJ: SUBCUTANEOUS | 1 refills | Status: DC

## 2021-05-18 NOTE — Assessment & Plan Note (Signed)
Repeat PAP was done today since last year's endocervical zone was not sampled .  There was blood in the vault,  Scant amounts.  But she has no symptoms of discharge, menstrual irregularity or pain. Last menses began oct 14

## 2021-05-18 NOTE — Assessment & Plan Note (Signed)

## 2021-05-18 NOTE — Assessment & Plan Note (Signed)
She has lost 15 lbs in 5 weeks with mounjaro.  Refill given

## 2021-05-18 NOTE — Assessment & Plan Note (Signed)
Left ankle  Seen by PT yesterday,  Rest advised

## 2021-05-18 NOTE — Addendum Note (Signed)
Addended by: Elita Boone E on: 05/18/2021 03:32 PM   Modules accepted: Orders

## 2021-05-18 NOTE — Progress Notes (Signed)
Patient ID: Amy Lara, female    DOB: [birth year]  Age: 45 y.o. MRN: [id number]  The patient is here for annual preventive examination and management of other chronic and acute problems.  This visit occurred during the SARS-CoV-2 public health emergency.  Safety protocols were in place, including screening questions prior to the visit, additional usage of staff PPE, and extensive cleaning of exam room while observing appropriate contact time as indicated for disinfecting solutions.     The risk factors are reflected in the social history.  The roster of all physicians providing medical care to patient - is listed in the Snapshot section of the chart.  Activities of daily living:  The patient is 100% independent in all ADLs: dressing, toileting, feeding as well as independent mobility  Home safety : The patient has smoke detectors in the home. They wear seatbelts.  There are no firearms at home. There is no violence in the home.   There is no risks for hepatitis, STDs or HIV. There is no   history of blood transfusion. They have no travel history to infectious disease endemic areas of the world.  The patient has seen their dentist in the last six month. They have seen their eye doctor in the last year. They admit to slight hearing difficulty with regard to whispered voices and some television programs.  They have deferred audiologic testing in the last year.  They do not  have excessive sun exposure. Discussed the need for sun protection: hats, long sleeves and use of sunscreen if there is significant sun exposure.   Diet: the importance of a healthy diet is discussed. They do have a healthy diet.  The benefits of regular aerobic exercise were discussed. She walks 4 times per week ,  20 minutes.   Depression screen: there are no signs or vegative symptoms of depression- irritability, change in appetite, anhedonia, sadness/tearfullness.  The following portions of the patient's history were  reviewed and updated as appropriate: allergies, current medications, past family history, past medical history,  past surgical history, past social history  and problem list.  Visual acuity was not assessed per patient preference since she has regular follow up with her ophthalmologist. Hearing and body mass index were assessed and reviewed.   During the course of the visit the patient was educated and counseled about appropriate screening and preventive services including : fall prevention , diabetes screening, nutrition counseling, colorectal cancer screening, and recommended immunizations.    CC: The primary encounter diagnosis was Encounter for screening mammogram for malignant neoplasm of breast. Diagnoses of Cervical cancer screening, Essential hypertension, Hyperlipidemia with target LDL less than 160, Obesity (BMI 30.0-34.9), Prediabetes, Cervical high risk HPV (human papillomavirus) test positive, Posterior tibial tendinitis of left lower extremity, and Tachycardia, unspecified were also pertinent to this visit.  1) obesity with hypertension and prediabetes:  losing weight with Mounjaro.  Had n/v x 24 hours after first dose,  none since then  still at starting dose.   2) left ankle pain this week started after walking 3.5 miles on Monday.   Saw PT> Rest and voltaren gel    History Amy Lara has a past medical history of Acquired pes planus of both feet, Allergy, Asthma, Fibrocystic breast disease in female, GERD (gastroesophageal reflux disease), Hyperlipidemia, Hypertension, Kidney stones, Plantar fasciitis, bilateral, Tachycardia, and Thalassemia carrier.   She has a past surgical history that includes Tubal ligation.   Her family history includes Atrial fibrillation (age of onset: 26) in her  mother; Cancer in her father and paternal uncle; Heart attack in her paternal aunt and paternal uncle; Heart disease in her paternal grandmother; Hyperlipidemia in her father, mother, paternal aunt,  paternal grandfather, and paternal uncle.She reports that she has never smoked. She has never used smokeless tobacco. She reports current alcohol use. She reports that she does not use drugs.  Outpatient Medications Prior to Visit  Medication Sig Dispense Refill   diphenhydrAMINE HCl (BENADRYL PO) Take by mouth as needed.     famotidine (PEPCID) 20 MG tablet Take 20 mg by mouth 2 (two) times daily.     Melatonin 10 MG TABS Take 1 tablet by mouth daily as needed.     metoprolol succinate (TOPROL-XL) 25 MG 24 hr tablet Take 1 tablet (25 mg total) by mouth daily. 90 tablet 3   MOUNJARO 2.5 MG/0.5ML Pen INJECT 2.5 MG INTO THE SKIN ONCE A WEEK. 6 mL 1   famotidine (PEPCID) 10 MG tablet Take 10 mg by mouth 2 (two) times daily. (Patient not taking: Reported on 05/18/2021)     loratadine (CLARITIN) 10 MG tablet Take 10 mg by mouth daily as needed. (Patient not taking: Reported on 04/11/2021)     omeprazole (PRILOSEC) 40 MG capsule Take 1 capsule (40 mg total) by mouth daily. (Patient not taking: Reported on 05/18/2021) 30 capsule 3   No facility-administered medications prior to visit.    Review of Systems  Patient denies headache, fevers, malaise, unintentional weight loss, skin rash, eye pain, sinus congestion and sinus pain, sore throat, dysphagia,  hemoptysis , cough, dyspnea, wheezing, chest pain, palpitations, orthopnea, edema, abdominal pain, nausea, melena, diarrhea, constipation, flank pain, dysuria, hematuria, urinary  Frequency, nocturia, numbness, tingling, seizures,  Focal weakness, Loss of consciousness,  Tremor, insomnia, depression, anxiety, and suicidal ideation.     Objective:  BP 114/66 (BP Location: Left Arm, Patient Position: Sitting, Cuff Size: Normal)   Pulse 85   Temp (!) 96 F (35.6 C) (Temporal)   Ht 5' 1.5" (1.562 m)   Wt 172 lb 3.2 oz (78.1 kg)   SpO2 99%   BMI 32.01 kg/m   Physical Exam  General Appearance:    Alert, cooperative, no distress, appears stated age   Head:    Normocephalic, without obvious abnormality, atraumatic  Eyes:    PERRL, conjunctiva/corneas clear, EOM's intact, fundi    benign, both eyes  Ears:    Normal TM's and external ear canals, both ears  Nose:   Nares normal, septum midline, mucosa normal, no drainage    or sinus tenderness  Throat:   Lips, mucosa, and tongue normal; teeth and gums normal  Neck:   Supple, symmetrical, trachea midline, no adenopathy;    thyroid:  no enlargement/tenderness/nodules; no carotid   bruit or JVD  Back:     Symmetric, no curvature, ROM normal, no CVA tenderness  Lungs:     Clear to auscultation bilaterally, respirations unlabored  Chest Wall:    No tenderness or deformity   Heart:    Regular rate and rhythm, S1 and S2 normal, no murmur, rub   or gallop  Breast Exam:    No tenderness, masses, or nipple abnormality  Abdomen:     Soft, non-tender, bowel sounds active all four quadrants,    no masses, no organomegaly  Genitalia:    Pelvic: cervix normal in appearance, external genitalia normal, no adnexal masses or tenderness, no cervical motion tenderness, rectovaginal septum normal, uterus normal size, shape, and consistency and vagina  normal without discharge  Extremities:   Extremities normal, atraumatic, no cyanosis or edema  Pulses:   2+ and symmetric all extremities  Skin:   Skin color, texture, turgor normal, no rashes or lesions  Lymph nodes:   Cervical, supraclavicular, and axillary nodes normal  Neurologic:   CNII-XII intact, normal strength, sensation and reflexes    throughout     Assessment & Plan:   Problem List Items Addressed This Visit     Breast cancer screening - Primary   Relevant Orders   MM 3D SCREEN BREAST BILATERAL   Cervical high risk HPV (human papillomavirus) test positive    Repeat PAP was done today since last year's endocervical zone was not sampled .  There was blood in the vault,  Scant amounts.  But she has no symptoms of discharge, menstrual irregularity  or pain. Last menses began oct 14       Essential hypertension   Relevant Orders   Comprehensive metabolic panel   Hyperlipidemia with target LDL less than 160   Relevant Orders   Lipid panel   Obesity (BMI 30.0-34.9)    She has lost 15 lbs in 5 weeks with mounjaro.  Refill given       Posterior tibial tendonitis    Left ankle  Seen by PT yesterday,  Rest advised       Tachycardia, unspecified    Managed with low dose metoprolol       Other Visit Diagnoses     Cervical cancer screening       Relevant Orders   Cytology - PAP   Prediabetes       Relevant Orders   Hemoglobin A1c       I have discontinued Yasuko Yohe's loratadine and omeprazole. I have also changed her Mounjaro. Additionally, I am having her maintain her Melatonin, diphenhydrAMINE HCl (BENADRYL PO), metoprolol succinate, and famotidine.  Meds ordered this encounter  Medications   tirzepatide (MOUNJARO) 2.5 MG/0.5ML Pen    Sig: INJECT 2.5 MG INTO THE SKIN ONCE A WEEK.    Dispense:  2 mL    Refill:  1    Medications Discontinued During This Encounter  Medication Reason   loratadine (CLARITIN) 10 MG tablet    omeprazole (PRILOSEC) 40 MG capsule    famotidine (PEPCID) 10 MG tablet Change in therapy   MOUNJARO 2.5 MG/0.5ML Pen Reorder    Follow-up: No follow-ups on file.   Sherlene Shams, MD

## 2021-05-18 NOTE — Assessment & Plan Note (Signed)
Managed with low dose metoprolol  

## 2021-05-19 LAB — HEMOGLOBIN A1C
Hgb A1c MFr Bld: 5.4 % of total Hgb (ref <5.7)
Mean Plasma Glucose: 108 mg/dL
eAG (mmol/L): 6 mmol/L

## 2021-05-23 LAB — CYTOLOGY - PAP
Comment: NEGATIVE
Diagnosis: NEGATIVE
High risk HPV: NEGATIVE

## 2021-06-01 ENCOUNTER — Ambulatory Visit: Payer: BC Managed Care – PPO | Admitting: Internal Medicine

## 2021-06-30 ENCOUNTER — Encounter: Payer: Self-pay | Admitting: Internal Medicine

## 2021-08-19 ENCOUNTER — Encounter: Payer: Self-pay | Admitting: Internal Medicine

## 2021-08-19 MED ORDER — TIRZEPATIDE 5 MG/0.5ML ~~LOC~~ SOAJ: 5.0000 mg | SUBCUTANEOUS | 1 refills | Status: DC

## 2021-10-08 ENCOUNTER — Encounter: Payer: Self-pay | Admitting: Internal Medicine

## 2021-10-13 ENCOUNTER — Other Ambulatory Visit: Payer: Self-pay

## 2021-10-13 DIAGNOSIS — E785 Hyperlipidemia, unspecified: Secondary | ICD-10-CM

## 2021-10-13 DIAGNOSIS — R7303 Prediabetes: Secondary | ICD-10-CM

## 2021-10-13 DIAGNOSIS — N912 Amenorrhea, unspecified: Secondary | ICD-10-CM

## 2021-10-13 DIAGNOSIS — E669 Obesity, unspecified: Secondary | ICD-10-CM

## 2021-10-13 DIAGNOSIS — I10 Essential (primary) hypertension: Secondary | ICD-10-CM

## 2021-10-16 ENCOUNTER — Other Ambulatory Visit: Payer: Self-pay | Admitting: Internal Medicine

## 2021-10-18 ENCOUNTER — Other Ambulatory Visit: Payer: BC Managed Care – PPO

## 2021-10-19 ENCOUNTER — Ambulatory Visit: Payer: BC Managed Care – PPO | Admitting: Internal Medicine

## 2021-10-20 ENCOUNTER — Other Ambulatory Visit (INDEPENDENT_AMBULATORY_CARE_PROVIDER_SITE_OTHER): Payer: BC Managed Care – PPO

## 2021-10-20 DIAGNOSIS — N912 Amenorrhea, unspecified: Secondary | ICD-10-CM | POA: Diagnosis not present

## 2021-10-20 DIAGNOSIS — R7303 Prediabetes: Secondary | ICD-10-CM

## 2021-10-20 DIAGNOSIS — E785 Hyperlipidemia, unspecified: Secondary | ICD-10-CM | POA: Diagnosis not present

## 2021-10-20 DIAGNOSIS — I10 Essential (primary) hypertension: Secondary | ICD-10-CM

## 2021-10-20 LAB — CBC WITH DIFFERENTIAL/PLATELET
Basophils Absolute: 0 10*3/uL (ref 0.0–0.1)
Basophils Relative: 0.5 % (ref 0.0–3.0)
Eosinophils Absolute: 0.1 10*3/uL (ref 0.0–0.7)
Eosinophils Relative: 0.8 % (ref 0.0–5.0)
HCT: 40 % (ref 36.0–46.0)
Hemoglobin: 12.6 g/dL (ref 12.0–15.0)
Lymphocytes Relative: 28 % (ref 12.0–46.0)
Lymphs Abs: 2.1 10*3/uL (ref 0.7–4.0)
MCHC: 31.5 g/dL (ref 30.0–36.0)
MCV: 69.4 fl — ABNORMAL LOW (ref 78.0–100.0)
Monocytes Absolute: 0.5 10*3/uL (ref 0.1–1.0)
Monocytes Relative: 7.2 % (ref 3.0–12.0)
Neutro Abs: 4.8 10*3/uL (ref 1.4–7.7)
Neutrophils Relative %: 63.5 % (ref 43.0–77.0)
Platelets: 286 10*3/uL (ref 150.0–400.0)
RBC: 5.77 Mil/uL — ABNORMAL HIGH (ref 3.87–5.11)
RDW: 15.8 % — ABNORMAL HIGH (ref 11.5–15.5)
WBC: 7.5 10*3/uL (ref 4.0–10.5)

## 2021-10-20 LAB — LIPID PANEL
Cholesterol: 185 mg/dL (ref 0–200)
HDL: 49.8 mg/dL (ref >39.00)
LDL Cholesterol: 121 mg/dL — ABNORMAL HIGH (ref 0–99)
NonHDL: 135.24
Total CHOL/HDL Ratio: 4
Triglycerides: 73 mg/dL (ref 0.0–149.0)
VLDL: 14.6 mg/dL (ref 0.0–40.0)

## 2021-10-20 LAB — COMPREHENSIVE METABOLIC PANEL
ALT: 14 U/L (ref 0–35)
AST: 16 U/L (ref 0–37)
Albumin: 4.2 g/dL (ref 3.5–5.2)
Alkaline Phosphatase: 53 U/L (ref 39–117)
BUN: 9 mg/dL (ref 6–23)
CO2: 26 mEq/L (ref 19–32)
Calcium: 9.2 mg/dL (ref 8.4–10.5)
Chloride: 105 mEq/L (ref 96–112)
Creatinine, Ser: 0.73 mg/dL (ref 0.40–1.20)
GFR: 98.89 mL/min (ref >60.00)
Glucose, Bld: 82 mg/dL (ref 70–99)
Potassium: 4.4 mEq/L (ref 3.5–5.1)
Sodium: 137 mEq/L (ref 135–145)
Total Bilirubin: 0.5 mg/dL (ref 0.2–1.2)
Total Protein: 7.2 g/dL (ref 6.0–8.3)

## 2021-10-20 LAB — HEMOGLOBIN A1C: Hgb A1c MFr Bld: 5.7 % (ref 4.6–6.5)

## 2021-10-24 ENCOUNTER — Ambulatory Visit (INDEPENDENT_AMBULATORY_CARE_PROVIDER_SITE_OTHER): Payer: BC Managed Care – PPO | Admitting: Internal Medicine

## 2021-10-24 ENCOUNTER — Encounter: Payer: Self-pay | Admitting: Internal Medicine

## 2021-10-24 ENCOUNTER — Telehealth: Payer: Self-pay

## 2021-10-24 DIAGNOSIS — E669 Obesity, unspecified: Secondary | ICD-10-CM | POA: Diagnosis not present

## 2021-10-24 DIAGNOSIS — E785 Hyperlipidemia, unspecified: Secondary | ICD-10-CM | POA: Diagnosis not present

## 2021-10-24 DIAGNOSIS — I951 Orthostatic hypotension: Secondary | ICD-10-CM

## 2021-10-24 NOTE — Assessment & Plan Note (Signed)
Mild, with LDL < 130  . No indication for treatment .Marland Kitchen  ?Lab Results  ?Component Value Date  ? CHOL 185 10/20/2021  ? HDL 49.80 10/20/2021  ? LDLCALC 121 (H) 10/20/2021  ? LDLDIRECT 112.5 12/30/2012  ? TRIG 73.0 10/20/2021  ? CHOLHDL 4 10/20/2021  ? ? ?

## 2021-10-24 NOTE — Assessment & Plan Note (Addendum)
Current Body mass index is 26.66 kg/m?Amy Lara   After losing >40 lbs using the GLP 1 agonist Mounjaro.  Her goal is to lose 20 more. 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

## 2021-10-24 NOTE — Assessment & Plan Note (Signed)
She has symptoms after standing up from a squatting position.  She is no longer being treated for hypertension .  systolics have been 110 to 115  Recommending increasing her salt intake  ?

## 2021-10-24 NOTE — Telephone Encounter (Signed)
PA for Amy Lara has been submitted to covermymeds.  ?

## 2021-10-24 NOTE — Progress Notes (Signed)
? ?Subjective:  ?Patient ID: Amy Lara, female    DOB: Mar 31, 1976  Age: 46 y.o. MRN: [id number] ? ?CC: Diagnoses of Hyperlipidemia with target LDL less than 160, Obesity (BMI 30.0-34.9), and Orthostasis were pertinent to this visit. ? ? ?This visit occurred during the SARS-CoV-2 public health emergency.  Safety protocols were in place, including screening questions prior to the visit, additional usage of staff PPE, and extensive cleaning of exam room while observing appropriate contact time as indicated for disinfecting solutions.   ? ?HPI ?Amy Lara presents for follow up on hypertension, obesity and prediabetes ?Chief Complaint  ?Patient presents with  ? Follow-up  ?  6 month follow up   ? ? ?1) obesity:  has lost 47 lbs since starting Mounjaro last  September.  She has lost the last 10 since increasing the dose from 2.5 to 5 mg  one month ago.  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 She will lose the rx copay card end of June . Goal is 128 lbs.   ? ?Labs reviewed ? ?2) HTN:  no longer taking medication .  BPs have been < 120 and she feels dizzy upon standing  ? ? ? ? ?Outpatient Medications Prior to Visit  ?Medication Sig Dispense Refill  ? diphenhydrAMINE HCl (BENADRYL PO) Take by mouth as needed.    ? famotidine (PEPCID) 20 MG tablet Take 20 mg by mouth 2 (two) times daily.    ? Melatonin 10 MG TABS Take 1 tablet by mouth daily as needed.    ? MOUNJARO 5 MG/0.5ML Pen INJECT 5 MG SUBCUTANEOUSLY WEEKLY 0.5 mL 1  ? Simethicone (GAS RELIEF 125 MAX ST PO) Take 1 capsule by mouth daily at 2 am.    ? metoprolol succinate (TOPROL-XL) 25 MG 24 hr tablet Take 1 tablet (25 mg total) by mouth daily. (Patient not taking: Reported on 10/24/2021) 90 tablet 3  ? ?No facility-administered medications prior to visit.  ? ? ?Review of Systems; ? ?Patient denies headache, fevers, malaise, unintentional weight loss, skin rash, eye pain, sinus congestion and sinus pain, sore throat, dysphagia,   hemoptysis , cough, dyspnea, wheezing, chest pain, palpitations, orthopnea, edema, abdominal pain, nausea, melena, diarrhea, constipation, flank pain, dysuria, hematuria, urinary  Frequency, nocturia, numbness, tingling, seizures,  Focal weakness, Loss of consciousness,  Tremor, insomnia, depression, anxiety, and suicidal ideation.   ? ? ? ?Objective:  ?BP 114/66 (BP Location: Left Arm, Patient Position: Sitting, Cuff Size: Normal)   Pulse (!) 102   Temp 98.9 ?F (37.2 ?C) (Oral)   Ht 5' 1.5" (1.562 m)   Wt 143 lb 6.4 oz (65 kg)   SpO2 98%   BMI 26.66 kg/m?  ? ?BP Readings from Last 3 Encounters:  ?10/24/21 114/66  ?05/18/21 114/66  ?04/11/21 130/82  ? ? ?Wt Readings from Last 3 Encounters:  ?10/24/21 143 lb 6.4 oz (65 kg)  ?05/18/21 172 lb 3.2 oz (78.1 kg)  ?04/11/21 187 lb 3.2 oz (84.9 kg)  ? ? ?General appearance: alert, cooperative and appears stated age ?Ears: normal TM's and external ear canals both ears ?Throat: lips, mucosa, and tongue normal; teeth and gums normal ?Neck: no adenopathy, no carotid bruit, supple, symmetrical, trachea midline and thyroid not enlarged, symmetric, no tenderness/mass/nodules ?Back: symmetric, no curvature. ROM normal. No CVA tenderness. ?Lungs: clear to auscultation bilaterally ?Heart: regular rate and rhythm, S1, S2 normal, no murmur, click, rub or gallop ?Abdomen: soft, non-tender; bowel sounds normal; no masses,  no organomegaly ?  Pulses: 2+ and symmetric ?Skin: Skin color, texture, turgor normal. No rashes or lesions ?Lymph nodes: Cervical, supraclavicular, and axillary nodes normal. ? ?Lab Results  ?Component Value Date  ? HGBA1C 5.7 10/20/2021  ? HGBA1C 5.4 05/18/2021  ? HGBA1C 5.9 (H) 05/07/2020  ? ? ?Lab Results  ?Component Value Date  ? CREATININE 0.73 10/20/2021  ? CREATININE 0.80 05/18/2021  ? CREATININE 0.71 09/23/2020  ? ? ?Lab Results  ?Component Value Date  ? WBC 7.5 10/20/2021  ? HGB 12.6 10/20/2021  ? HCT 40.0 10/20/2021  ? PLT 286.0 10/20/2021  ? GLUCOSE 82  10/20/2021  ? CHOL 185 10/20/2021  ? TRIG 73.0 10/20/2021  ? HDL 49.80 10/20/2021  ? LDLDIRECT 112.5 12/30/2012  ? LDLCALC 121 (H) 10/20/2021  ? ALT 14 10/20/2021  ? AST 16 10/20/2021  ? NA 137 10/20/2021  ? K 4.4 10/20/2021  ? CL 105 10/20/2021  ? CREATININE 0.73 10/20/2021  ? BUN 9 10/20/2021  ? CO2 26 10/20/2021  ? TSH CANCELED 02/08/2021  ? HGBA1C 5.7 10/20/2021  ? MICROALBUR <0.7 10/20/2019  ? ? ?No results found. ? ?Assessment & Plan:  ? ?Problem List Items Addressed This Visit   ? ? Hyperlipidemia with target LDL less than 160  ?  Mild, with LDL < 130  . No indication for treatment .Marland Kitchen  ?Lab Results  ?Component Value Date  ? CHOL 185 10/20/2021  ? HDL 49.80 10/20/2021  ? LDLCALC 121 (H) 10/20/2021  ? LDLDIRECT 112.5 12/30/2012  ? TRIG 73.0 10/20/2021  ? CHOLHDL 4 10/20/2021  ? ? ?  ?  ? Obesity (BMI 30.0-34.9)  ?  Current Body mass index is 26.66 kg/m?Marland Kitchen   After losing >40 lbs using the GLP 1 agonist Mounjaro.  Her goal is to lose 20 more. 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  ?  ? Orthostasis  ?  She has symptoms after standing up from a squatting position.  She is no longer being treated for hypertension .  systolics have been 110 to 115  Recommending increasing her salt intake  ?  ?  ? ? ?I spent 30 minutes dedicated to the care of this patient on the date of this encounter to include pre-visit review of patient's medical history,  most recent  office visits with me and patient's specialists,  most recent ER visit/hospitalization, EKG, imaging studies, Face-to-face time with the patient , and post visit ordering of testing and therapeutics.   ? ?Follow-up: No follow-ups on file. ? ? ?Sherlene Shams, MD ?

## 2021-10-26 DIAGNOSIS — Z111 Encounter for screening for respiratory tuberculosis: Secondary | ICD-10-CM | POA: Diagnosis not present

## 2021-10-28 DIAGNOSIS — Z111 Encounter for screening for respiratory tuberculosis: Secondary | ICD-10-CM | POA: Diagnosis not present

## 2021-11-09 NOTE — Telephone Encounter (Signed)
PA for Greggory Keen has been denied. Pt is aware.  ?

## 2021-12-12 ENCOUNTER — Other Ambulatory Visit: Payer: Self-pay | Admitting: Internal Medicine

## 2021-12-30 ENCOUNTER — Encounter: Payer: Self-pay | Admitting: Internal Medicine

## 2022-01-01 ENCOUNTER — Encounter: Payer: Self-pay | Admitting: Internal Medicine

## 2022-01-04 MED ORDER — TIRZEPATIDE 7.5 MG/0.5ML ~~LOC~~ SOAJ: 7.5000 mg | SUBCUTANEOUS | 2 refills | Status: DC

## 2022-01-24 ENCOUNTER — Ambulatory Visit
Admission: RE | Admit: 2022-01-24 | Discharge: 2022-01-24 | Disposition: A | Discharge status: Home / Self Care | Payer: BC Managed Care – PPO | Source: Ambulatory Visit | Attending: Internal Medicine | Admitting: Internal Medicine

## 2022-01-24 DIAGNOSIS — Z1231 Encounter for screening mammogram for malignant neoplasm of breast: Secondary | ICD-10-CM | POA: Insufficient documentation

## 2022-03-15 ENCOUNTER — Encounter: Payer: Self-pay | Admitting: Internal Medicine

## 2022-03-16 ENCOUNTER — Telehealth: Payer: Self-pay

## 2022-03-16 ENCOUNTER — Other Ambulatory Visit: Payer: Self-pay

## 2022-03-16 MED ORDER — TIRZEPATIDE 7.5 MG/0.5ML ~~LOC~~ SOAJ
7.5000 mg | SUBCUTANEOUS | 2 refills | Status: DC
  Filled 2022-03-16 (×2): qty 2, 28d supply, fill #0
  Filled 2022-05-09: qty 2, 28d supply, fill #1

## 2022-03-16 NOTE — Telephone Encounter (Signed)
PA for Mounjaro 7.5 mg has been submitted on covermymeds.  ?

## 2022-04-17 ENCOUNTER — Encounter: Payer: Self-pay | Admitting: Internal Medicine

## 2022-04-17 DIAGNOSIS — E785 Hyperlipidemia, unspecified: Secondary | ICD-10-CM

## 2022-04-17 DIAGNOSIS — R7303 Prediabetes: Secondary | ICD-10-CM

## 2022-04-17 DIAGNOSIS — Z79899 Other long term (current) drug therapy: Secondary | ICD-10-CM

## 2022-05-09 ENCOUNTER — Other Ambulatory Visit: Payer: Self-pay

## 2022-05-18 ENCOUNTER — Other Ambulatory Visit (INDEPENDENT_AMBULATORY_CARE_PROVIDER_SITE_OTHER): Payer: 59

## 2022-05-18 DIAGNOSIS — E785 Hyperlipidemia, unspecified: Secondary | ICD-10-CM | POA: Diagnosis not present

## 2022-05-18 DIAGNOSIS — R7303 Prediabetes: Secondary | ICD-10-CM

## 2022-05-18 DIAGNOSIS — Z79899 Other long term (current) drug therapy: Secondary | ICD-10-CM

## 2022-05-18 LAB — CBC WITH DIFFERENTIAL/PLATELET
Basophils Absolute: 0 10*3/uL (ref 0.0–0.1)
Basophils Relative: 0.1 % (ref 0.0–3.0)
Eosinophils Absolute: 0.4 10*3/uL (ref 0.0–0.7)
Eosinophils Relative: 5.1 % — ABNORMAL HIGH (ref 0.0–5.0)
HCT: 38.2 % (ref 36.0–46.0)
Hemoglobin: 12.1 g/dL (ref 12.0–15.0)
Lymphocytes Relative: 18.5 % (ref 12.0–46.0)
Lymphs Abs: 1.4 10*3/uL (ref 0.7–4.0)
MCHC: 31.5 g/dL (ref 30.0–36.0)
MCV: 69.8 fl — ABNORMAL LOW (ref 78.0–100.0)
Monocytes Absolute: 0.6 10*3/uL (ref 0.1–1.0)
Monocytes Relative: 7.6 % (ref 3.0–12.0)
Neutro Abs: 5.2 10*3/uL (ref 1.4–7.7)
Neutrophils Relative %: 68.7 % (ref 43.0–77.0)
Platelets: 225 10*3/uL (ref 150.0–400.0)
RBC: 5.48 Mil/uL — ABNORMAL HIGH (ref 3.87–5.11)
RDW: 14.1 % (ref 11.5–15.5)
WBC: 7.5 10*3/uL (ref 4.0–10.5)

## 2022-05-18 LAB — COMPREHENSIVE METABOLIC PANEL
ALT: 11 U/L (ref 0–35)
AST: 13 U/L (ref 0–37)
Albumin: 4 g/dL (ref 3.5–5.2)
Alkaline Phosphatase: 48 U/L (ref 39–117)
BUN: 9 mg/dL (ref 6–23)
CO2: 29 mEq/L (ref 19–32)
Calcium: 8.8 mg/dL (ref 8.4–10.5)
Chloride: 101 mEq/L (ref 96–112)
Creatinine, Ser: 0.7 mg/dL (ref 0.40–1.20)
GFR: 103.58 mL/min (ref >60.00)
Glucose, Bld: 78 mg/dL (ref 70–99)
Potassium: 4 mEq/L (ref 3.5–5.1)
Sodium: 137 mEq/L (ref 135–145)
Total Bilirubin: 0.4 mg/dL (ref 0.2–1.2)
Total Protein: 6.7 g/dL (ref 6.0–8.3)

## 2022-05-18 LAB — LIPID PANEL
Cholesterol: 179 mg/dL (ref 0–200)
HDL: 55.9 mg/dL (ref >39.00)
LDL Cholesterol: 111 mg/dL — ABNORMAL HIGH (ref 0–99)
NonHDL: 123.36
Total CHOL/HDL Ratio: 3
Triglycerides: 62 mg/dL (ref 0.0–149.0)
VLDL: 12.4 mg/dL (ref 0.0–40.0)

## 2022-05-18 LAB — HEMOGLOBIN A1C: Hgb A1c MFr Bld: 6.4 % (ref 4.6–6.5)

## 2022-05-18 LAB — TSH: TSH: 1.23 u[IU]/mL (ref 0.35–5.50)

## 2022-05-18 LAB — LDL CHOLESTEROL, DIRECT: Direct LDL: 109 mg/dL

## 2022-05-19 ENCOUNTER — Encounter: Payer: BC Managed Care – PPO | Admitting: Internal Medicine

## 2022-05-23 ENCOUNTER — Encounter: Payer: Self-pay | Admitting: Internal Medicine

## 2022-05-23 ENCOUNTER — Ambulatory Visit (INDEPENDENT_AMBULATORY_CARE_PROVIDER_SITE_OTHER): Payer: 59 | Admitting: Internal Medicine

## 2022-05-23 VITALS — BP 116/70 | HR 77 | Temp 97.7°F | Ht 61.5 in | Wt 131.6 lb

## 2022-05-23 DIAGNOSIS — Z0000 Encounter for general adult medical examination without abnormal findings: Secondary | ICD-10-CM

## 2022-05-23 DIAGNOSIS — E669 Obesity, unspecified: Secondary | ICD-10-CM

## 2022-05-23 DIAGNOSIS — Z1211 Encounter for screening for malignant neoplasm of colon: Secondary | ICD-10-CM

## 2022-05-23 DIAGNOSIS — R7303 Prediabetes: Secondary | ICD-10-CM

## 2022-05-23 NOTE — Patient Instructions (Signed)
Cologuard ordered  Let me know if and when mounjaro needs to be refilled AND at what dose

## 2022-05-23 NOTE — Progress Notes (Signed)
The patient is here for annual preventive examination and management of other chronic and acute problems.   The risk factors are reflected in the social history.   The roster of all physicians providing medical care to patient - is listed in the Snapshot section of the chart.   Activities of daily living:  The patient is 100% independent in all ADLs: dressing, toileting, feeding as well as independent mobility   Home safety : The patient has smoke detectors in the home. They wear seatbelts.  There are no unsecured firearms at home. There is no violence in the home.    There is no risks for hepatitis, STDs or HIV. There is no   history of blood transfusion. They have no travel history to infectious disease endemic areas of the world.   The patient has seen their dentist in the last six month. They have seen their eye doctor in the last year. The patinet  denies slight hearing difficulty with regard to whispered voices and some television programs.  They have deferred audiologic testing in the last year.  They do not  have excessive sun exposure. Discussed the need for sun protection: hats, long sleeves and use of sunscreen if there is significant sun exposure.    Diet: the importance of a healthy diet is discussed. They do have a healthy diet.   The benefits of regular aerobic exercise were discussed. The patient  exercises  3 to 5 days per week  for  60 minutes.    Depression screen: there are no signs or vegative symptoms of depression- irritability, change in appetite, anhedonia, sadness/tearfullness.   The following portions of the patient's history were reviewed and updated as appropriate: allergies, current medications, past family history, past medical history,  past surgical history, past social history  and problem list.   Visual acuity was not assessed per patient preference since the patient has regular follow up with an  ophthalmologist. Hearing and body mass index were assessed  and reviewed.    During the course of the visit the patient was educated and counseled about appropriate screening and preventive services including : fall prevention , diabetes screening, nutrition counseling, colorectal cancer screening, and recommended immunizations.    Chief Complaint:  1) obesity: has lost 59 lbs on Mounjaro based on home weights started Oct 2022. Has been taking it every other week since August to maintain a slower rate of loss.  Needs a lower dose due to nausea but has a supple of 7.5 mg until December.  $47 per month,  lasting 2 months .  Taking magnesium gummy and eating prunes  moving bowels daily   2) prediabetes:  based on her A1c.  No history of elevated blood sugars.  Taking mounjaro for weight loss.  Will check fructosamine with next A1c    3)    Review of Symptoms  Patient denies headache, fevers, malaise, unintentional weight loss, skin rash, eye pain, sinus congestion and sinus pain, sore throat, dysphagia,  hemoptysis , cough, dyspnea, wheezing, chest pain, palpitations, orthopnea, edema, abdominal pain, nausea, melena, diarrhea, constipation, flank pain, dysuria, hematuria, urinary  Frequency, nocturia, numbness, tingling, seizures,  Focal weakness, Loss of consciousness,  Tremor, insomnia, depression, anxiety, and suicidal ideation.    Physical Exam:  BP 116/70 (BP Location: Left Arm, Patient Position: Sitting, Cuff Size: Normal)   Pulse 77   Temp 97.7 F (36.5 C) (Oral)   Ht 5' 1.5" (1.562 m)   Wt 131 lb 9.6 oz (  59.7 kg)   SpO2 98%   BMI 24.46 kg/m    General appearance: alert, cooperative and appears stated age Head: Normocephalic, without obvious abnormality, atraumatic Eyes: conjunctivae/corneas clear. PERRL, EOM's intact. Fundi benign. Ears: normal TM's and external ear canals both ears Nose: Nares normal. Septum midline. Mucosa normal. No drainage or sinus tenderness. Throat: lips, mucosa, and tongue normal; teeth and gums normal Neck: no  adenopathy, no carotid bruit, no JVD, supple, symmetrical, trachea midline and thyroid not enlarged, symmetric, no tenderness/mass/nodules Lungs: clear to auscultation bilaterally Breasts: normal appearance, no masses or tenderness Heart: regular rate and rhythm, S1, S2 normal, no murmur, click, rub or gallop Abdomen: soft, non-tender; bowel sounds normal; no masses,  no organomegaly Extremities: extremities normal, atraumatic, no cyanosis or edema Pulses: 2+ and symmetric Skin: Skin color, texture, turgor normal. No rashes or lesions Neurologic: Alert and oriented X 3, normal strength and tone. Normal symmetric reflexes. Normal coordination and gait.     Assessment and Plan:  Obesity (BMI 30.0-34.9) Current Body mass index is 24.46 kg/m.   After losing >50 lbs using the GLP 1 agonist Mounjaro.  . She is exercising regularly,  and has increased the interval to every 14 days   Encounter for preventive health examination age appropriate education and counseling updated, referrals for preventative services and immunizations addressed, dietary and smoking counseling addressed, most recent labs reviewed.  I have personally reviewed and have noted:   1) the patient's medical and social history 2) The pt's use of alcohol, tobacco, and illicit drugs 3) The patient's current medications and supplements 4) Functional ability including ADL's, fall risk, home safety risk, hearing and visual impairment 5) Diet and physical activities 6) Evidence for depression or mood disorder 7) The patient's height, weight, and BMI have been recorded in the chart   I have made referrals, and provided counseling and education based on review of the above   Updated Medication List Outpatient Encounter Medications as of 05/23/2022  Medication Sig   diphenhydrAMINE HCl (BENADRYL PO) Take by mouth as needed.   famotidine (PEPCID) 20 MG tablet Take 20 mg by mouth 2 (two) times daily.   Melatonin 10 MG TABS Take 1  tablet by mouth daily as needed.   Simethicone (GAS RELIEF 125 MAX ST PO) Take 1 capsule by mouth daily at 2 am.   tirzepatide (MOUNJARO) 7.5 MG/0.5ML Pen Inject 7.5 mg into the skin once a week.   No facility-administered encounter medications on file as of 05/23/2022.

## 2022-05-23 NOTE — Assessment & Plan Note (Signed)

## 2022-05-23 NOTE — Assessment & Plan Note (Signed)
Current Body mass index is 24.46 kg/m.   After losing >50 lbs using the GLP 1 agonist Mounjaro.  . She is exercising regularly,  and has increased the interval to every 14 days

## 2022-06-27 NOTE — Telephone Encounter (Signed)
MyChart messgae sent to patient. 

## 2022-07-29 DIAGNOSIS — Z1211 Encounter for screening for malignant neoplasm of colon: Secondary | ICD-10-CM | POA: Diagnosis not present

## 2022-08-08 LAB — COLOGUARD: COLOGUARD: NEGATIVE

## 2022-10-25 ENCOUNTER — Encounter: Payer: Self-pay | Admitting: Internal Medicine

## 2022-10-25 DIAGNOSIS — E785 Hyperlipidemia, unspecified: Secondary | ICD-10-CM

## 2022-10-25 DIAGNOSIS — R7303 Prediabetes: Secondary | ICD-10-CM

## 2022-10-26 NOTE — Telephone Encounter (Signed)
I have pended labs for your approval.  

## 2022-11-10 ENCOUNTER — Other Ambulatory Visit (INDEPENDENT_AMBULATORY_CARE_PROVIDER_SITE_OTHER): Payer: Commercial Managed Care - PPO

## 2022-11-10 ENCOUNTER — Other Ambulatory Visit: Payer: Commercial Managed Care - PPO

## 2022-11-10 DIAGNOSIS — R7303 Prediabetes: Secondary | ICD-10-CM | POA: Diagnosis not present

## 2022-11-10 LAB — COMPREHENSIVE METABOLIC PANEL
ALT: 13 U/L (ref 0–35)
AST: 18 U/L (ref 0–37)
Albumin: 4.1 g/dL (ref 3.5–5.2)
Alkaline Phosphatase: 56 U/L (ref 39–117)
BUN: 10 mg/dL (ref 6–23)
CO2: 25 mEq/L (ref 19–32)
Calcium: 9.1 mg/dL (ref 8.4–10.5)
Chloride: 105 mEq/L (ref 96–112)
Creatinine, Ser: 0.69 mg/dL (ref 0.40–1.20)
GFR: 103.59 mL/min (ref >60.00)
Glucose, Bld: 79 mg/dL (ref 70–99)
Potassium: 4.3 mEq/L (ref 3.5–5.1)
Sodium: 139 mEq/L (ref 135–145)
Total Bilirubin: 0.4 mg/dL (ref 0.2–1.2)
Total Protein: 7.1 g/dL (ref 6.0–8.3)

## 2022-11-11 LAB — HEMOGLOBIN A1C
Hgb A1c MFr Bld: 5.7 % of total Hgb — ABNORMAL HIGH (ref <5.7)
Mean Plasma Glucose: 117 mg/dL
eAG (mmol/L): 6.5 mmol/L

## 2022-11-14 LAB — FRUCTOSAMINE: Fructosamine: 252 umol/L (ref 205–285)

## 2022-11-22 ENCOUNTER — Ambulatory Visit: Payer: Commercial Managed Care - PPO | Admitting: Internal Medicine

## 2022-11-22 VITALS — BP 122/68 | HR 99 | Temp 98.1°F | Ht 61.5 in | Wt 165.2 lb

## 2022-11-22 DIAGNOSIS — E669 Obesity, unspecified: Secondary | ICD-10-CM

## 2022-11-22 DIAGNOSIS — N912 Amenorrhea, unspecified: Secondary | ICD-10-CM | POA: Diagnosis not present

## 2022-11-22 DIAGNOSIS — Z6830 Body mass index (BMI) 30.0-30.9, adult: Secondary | ICD-10-CM

## 2022-11-22 DIAGNOSIS — N951 Menopausal and female climacteric states: Secondary | ICD-10-CM | POA: Insufficient documentation

## 2022-11-22 NOTE — Patient Instructions (Signed)
You might want to try using Relaxium for insomnia  (as seen on TV commercials) . It contains:  Melatonin 5 mg  Chamomile 25 mg Passionflower extract 75 mg GABA 100 mg Ashwaganda extract 125 mg Magnesium citrate, glycinate, oxide (100 mg)  L tryptophan 500 mg Valerest (proprietary  ingredient ; probably valeria root extract)   http://lester.info/   This website  offers discounts for ozempic.   OZEMPIC PENS CAN BE MANIPULATED INTO DELIVERING LOWER DOSES BY "COUNTING CLICKS"  IF YOU ARE USING THE 8 MG PEN (GOLD COLOR) THAT DELIVERS THE 2 MG WEEKLY DOSE  10 CLICKS   0.25 MG DOSE 19 CLICK     0.50 MG DOSE 37 CLICKS   1.0 MG DOSE 56 CLICKS   1.5 MG DOSE    THE 4 MG PEN  (TEAL( THAT DELIVERS THE 1 MG WEEKLY DOSE:  19 CLICKS    0.25 MG DOSE 37 CLICKS    0.50 MG DOSE

## 2022-11-22 NOTE — Assessment & Plan Note (Signed)
Counselling on Diet and exercise given.  Patient was given the website for ozempic to research the OOP cost since the higher dose can be prescribed and manipulated to deliver lower doses

## 2022-11-22 NOTE — Assessment & Plan Note (Signed)
She has had secondary amenorrhea, and recurrent hot flashes .  FSH/LH and TSH ordered

## 2022-11-22 NOTE — Progress Notes (Unsigned)
Subjective:  Patient ID: Amy Lara, female    DOB: 1976/06/25  Age: 47 y.o. MRN: [id number]  CC: The primary encounter diagnosis was Amenorrhea. Diagnoses of Obesity (BMI 30.0-34.9) and Symptoms, such as flushing, sleeplessness, headache, lack of concentration, associated with the menopause were also pertinent to this visit.   HPI Amy Lara presents for  Chief Complaint  Patient presents with   Medical Management of Chronic Issues   1) overweight:  lost 59 lbs on mounjaro  which she started in Oct 2022.  Reached a nadir of 132 in November.  Tried reducing the dose to every other week which causes excessive vomiting , so she eventually stopped the mounjaro .  Since stopping it she has become amenorrheic,  and has developed  joint pain and an eczematous rash on her forehead  the mediation is now no longer covered.Marland Kitchen  and she has gained 33 lbs   (Fructosamine x 0.017) + 1.61 = A1c    5.8   She is walking for exercise 4 days per week.     Outpatient Medications Prior to Visit  Medication Sig Dispense Refill   diphenhydrAMINE HCl (BENADRYL PO) Take by mouth as needed.     famotidine (PEPCID) 20 MG tablet Take 20 mg by mouth 2 (two) times daily.     Melatonin 10 MG TABS Take 1 tablet by mouth daily as needed.     Simethicone (GAS RELIEF 125 MAX ST PO) Take 1 capsule by mouth daily at 2 am.     tirzepatide (MOUNJARO) 7.5 MG/0.5ML Pen Inject 7.5 mg into the skin once a week. (Patient not taking: Reported on 11/22/2022) 2 mL 2   No facility-administered medications prior to visit.    Review of Systems;  Patient denies headache, fevers, malaise, unintentional weight loss, skin rash, eye pain, sinus congestion and sinus pain, sore throat, dysphagia,  hemoptysis , cough, dyspnea, wheezing, chest pain, palpitations, orthopnea, edema, abdominal pain, nausea, melena, diarrhea, constipation, flank pain, dysuria, hematuria, urinary  Frequency, nocturia, numbness, tingling, seizures,  Focal  weakness, Loss of consciousness,  Tremor, insomnia, depression, anxiety, and suicidal ideation.      Objective:  BP 122/68   Pulse 99   Temp 98.1 F (36.7 C) (Oral)   Ht 5' 1.5" (1.562 m)   Wt 165 lb 3.2 oz (74.9 kg)   SpO2 96%   BMI 30.71 kg/m   BP Readings from Last 3 Encounters:  11/22/22 122/68  05/23/22 116/70  10/24/21 114/66    Wt Readings from Last 3 Encounters:  11/22/22 165 lb 3.2 oz (74.9 kg)  05/23/22 131 lb 9.6 oz (59.7 kg)  10/24/21 143 lb 6.4 oz (65 kg)    Physical Exam Vitals reviewed.  Constitutional:      General: She is not in acute distress.    Appearance: Normal appearance. She is normal weight. She is not ill-appearing, toxic-appearing or diaphoretic.  HENT:     Head: Normocephalic.  Eyes:     General: No scleral icterus.       Right eye: No discharge.        Left eye: No discharge.     Conjunctiva/sclera: Conjunctivae normal.  Cardiovascular:     Rate and Rhythm: Normal rate and regular rhythm.     Heart sounds: Normal heart sounds.  Pulmonary:     Effort: Pulmonary effort is normal. No respiratory distress.     Breath sounds: Normal breath sounds.  Musculoskeletal:        General:  Normal range of motion.  Skin:    General: Skin is warm and dry.  Neurological:     General: No focal deficit present.     Mental Status: She is alert and oriented to person, place, and time. Mental status is at baseline.  Psychiatric:        Mood and Affect: Mood normal.        Behavior: Behavior normal.        Thought Content: Thought content normal.        Judgment: Judgment normal.    Lab Results  Component Value Date   HGBA1C 5.7 (H) 11/10/2022   HGBA1C 6.4 05/18/2022   HGBA1C 5.7 10/20/2021    Lab Results  Component Value Date   CREATININE 0.69 11/10/2022   CREATININE 0.70 05/18/2022   CREATININE 0.73 10/20/2021    Lab Results  Component Value Date   WBC 7.5 05/18/2022   HGB 12.1 05/18/2022   HCT 38.2 05/18/2022   PLT 225.0  05/18/2022   GLUCOSE 79 11/10/2022   CHOL 179 05/18/2022   TRIG 62.0 05/18/2022   HDL 55.90 05/18/2022   LDLDIRECT 109.0 05/18/2022   LDLCALC 111 (H) 05/18/2022   ALT 13 11/10/2022   AST 18 11/10/2022   NA 139 11/10/2022   K 4.3 11/10/2022   CL 105 11/10/2022   CREATININE 0.69 11/10/2022   BUN 10 11/10/2022   CO2 25 11/10/2022   TSH 1.23 05/18/2022   HGBA1C 5.7 (H) 11/10/2022   MICROALBUR <0.7 10/20/2019    MM 3D SCREEN BREAST BILATERAL  Result Date: 01/25/2022 CLINICAL DATA:  Screening. EXAM: DIGITAL SCREENING BILATERAL MAMMOGRAM WITH TOMOSYNTHESIS AND CAD TECHNIQUE: Bilateral screening digital craniocaudal and mediolateral oblique mammograms were obtained. Bilateral screening digital breast tomosynthesis was performed. The images were evaluated with computer-aided detection. COMPARISON:  Previous exam(s). ACR Breast Density Category c: The breast tissue is heterogeneously dense, which may obscure small masses. FINDINGS: There are no findings suspicious for malignancy. IMPRESSION: No mammographic evidence of malignancy. A result letter of this screening mammogram will be mailed directly to the patient. RECOMMENDATION: Screening mammogram in one year. (Code:SM-B-01Y) BI-RADS CATEGORY  1: Negative. Electronically Signed   By: Emmaline Kluver M.D.   On: 01/25/2022 10:49    Assessment & Plan:  .Amenorrhea -     Follicle stimulating hormone -     Luteinizing hormone -     TSH  Obesity (BMI 30.0-34.9) Assessment & Plan: Counselling on Diet and exercise given.  Patient was given the website for ozempic to research the OOP cost since the higher dose can be prescribed and manipulated to deliver lower doses    Symptoms, such as flushing, sleeplessness, headache, lack of concentration, associated with the menopause Assessment & Plan: She has had secondary amenorrhea, and recurrent hot flashes .  FSH/LH and TSH ordered       I provided 34 minutes of face-to-face time during this  encounter reviewing patient's last visit with me, counseling on lifestyle, diet and exercise ,  and post visit ordering to diagnostics and therapeutics .   Follow-up: No follow-ups on file.   Sherlene Shams, MD

## 2022-11-23 LAB — FOLLICLE STIMULATING HORMONE: FSH: 46.3 m[IU]/mL

## 2022-11-23 LAB — LUTEINIZING HORMONE: LH: 37.81 m[IU]/mL

## 2022-11-23 LAB — TSH: TSH: 1.74 u[IU]/mL (ref 0.35–5.50)

## 2022-12-27 ENCOUNTER — Other Ambulatory Visit: Payer: Self-pay | Admitting: Internal Medicine

## 2022-12-27 DIAGNOSIS — Z1231 Encounter for screening mammogram for malignant neoplasm of breast: Secondary | ICD-10-CM

## 2023-01-26 ENCOUNTER — Ambulatory Visit
Admission: RE | Admit: 2023-01-26 | Discharge: 2023-01-26 | Disposition: A | Discharge status: Home / Self Care | Payer: Commercial Managed Care - PPO | Source: Ambulatory Visit | Attending: Internal Medicine | Admitting: Internal Medicine

## 2023-01-26 DIAGNOSIS — Z1231 Encounter for screening mammogram for malignant neoplasm of breast: Secondary | ICD-10-CM | POA: Diagnosis not present

## 2023-05-25 ENCOUNTER — Encounter: Payer: Self-pay | Admitting: Internal Medicine

## 2023-05-25 ENCOUNTER — Ambulatory Visit: Payer: Commercial Managed Care - PPO | Admitting: Internal Medicine

## 2023-05-25 ENCOUNTER — Other Ambulatory Visit: Payer: Self-pay

## 2023-05-25 VITALS — BP 118/70 | HR 97 | Temp 98.1°F | Ht 61.5 in | Wt 169.4 lb

## 2023-05-25 DIAGNOSIS — R202 Paresthesia of skin: Secondary | ICD-10-CM | POA: Diagnosis not present

## 2023-05-25 DIAGNOSIS — E785 Hyperlipidemia, unspecified: Secondary | ICD-10-CM | POA: Diagnosis not present

## 2023-05-25 DIAGNOSIS — H8110 Benign paroxysmal vertigo, unspecified ear: Secondary | ICD-10-CM | POA: Diagnosis not present

## 2023-05-25 DIAGNOSIS — N951 Menopausal and female climacteric states: Secondary | ICD-10-CM | POA: Diagnosis not present

## 2023-05-25 DIAGNOSIS — E66811 Obesity, class 1: Secondary | ICD-10-CM | POA: Diagnosis not present

## 2023-05-25 DIAGNOSIS — R7303 Prediabetes: Secondary | ICD-10-CM

## 2023-05-25 DIAGNOSIS — R200 Anesthesia of skin: Secondary | ICD-10-CM

## 2023-05-25 MED ORDER — PREDNISONE 10 MG PO TABS: ORAL_TABLET | ORAL | 0 refills | Status: DC

## 2023-05-25 MED ORDER — PREDNISONE 10 MG PO TABS
ORAL_TABLET | ORAL | 0 refills | Status: DC
  Filled 2023-05-25: qty 21, 6d supply, fill #0

## 2023-05-25 NOTE — Progress Notes (Unsigned)
Subjective:  Patient ID: Amy Lara, female    DOB: 06/13/1976  Age: 47 y.o. MRN: [id number]  CC: The primary encounter diagnosis was Hyperlipidemia with target LDL less than 160. Diagnoses of Obesity (BMI 30.0-34.9), Prediabetes, Numbness and tingling in both hands, Perimenopause, and Benign paroxysmal positional vertigo, unspecified laterality were also pertinent to this visit.   HPI Amy Lara presents for  Chief Complaint  Patient presents with   Medical Management of Chronic Issues   Cc:  bilateral tingling and numbness of hands, .   R > L  all 5 fingers going numb.  Told she had thoracic outlet symptoms by her physical therapist at [employer].  Symptoms are brought on by raising arms over the head or  by activities that involve the flexion of the elbow   2) has been  having vertigo when lying on side and flipping over to right    3) menopause by history and FSH/LH.  Last menses occurred in May 2024  while taking GLP 1 agonist  but stopped during suspension of tmedication and subsequent weight gain.     4) Obesity: stopped Mounjaro in November due to [facility] cost, tried the compounded GLP 1 agonist    Outpatient Medications Prior to Visit  Medication Sig Dispense Refill   diphenhydrAMINE HCl (BENADRYL PO) Take by mouth as needed.     famotidine (PEPCID) 20 MG tablet Take 20 mg by mouth 2 (two) times daily. Patient confirmed she takes this once daily     Melatonin 10 MG TABS Take 1 tablet by mouth daily as needed.     Simethicone (GAS RELIEF 125 MAX ST PO) Take 1 capsule by mouth daily at 2 am.     No facility-administered medications prior to visit.    Review of Systems;  Patient denies headache, fevers, malaise, unintentional weight loss, skin rash, eye pain, sinus congestion and sinus pain, sore throat, dysphagia,  hemoptysis , cough, dyspnea, wheezing, chest pain, palpitations, orthopnea, edema, abdominal pain, nausea, melena, diarrhea, constipation, flank pain, dysuria,  hematuria, urinary  Frequency, nocturia,  seizures,  Focal weakness, Loss of consciousness,  Tremor, insomnia, depression, anxiety, and suicidal ideation.      Objective:  BP 118/70   Pulse 97   Temp 98.1 F (36.7 C)   Ht 5' 1.5" (1.562 m)   Wt 169 lb 6.4 oz (76.8 kg)   SpO2 97%   BMI 31.49 kg/m   BP Readings from Last 3 Encounters:  05/25/23 118/70  11/22/22 122/68  05/23/22 116/70    Wt Readings from Last 3 Encounters:  05/25/23 169 lb 6.4 oz (76.8 kg)  11/22/22 165 lb 3.2 oz (74.9 kg)  05/23/22 131 lb 9.6 oz (59.7 kg)    Physical Exam Vitals reviewed.  Constitutional:      General: She is not in acute distress.    Appearance: Normal appearance. She is normal weight. She is not ill-appearing, toxic-appearing or diaphoretic.  HENT:     Head: Normocephalic.  Eyes:     General: No scleral icterus.       Right eye: No discharge.        Left eye: No discharge.     Conjunctiva/sclera: Conjunctivae normal.  Cardiovascular:     Rate and Rhythm: Normal rate and regular rhythm.     Pulses: Normal pulses.     Heart sounds: Normal heart sounds.  Pulmonary:     Effort: Pulmonary effort is normal. No respiratory distress.     Breath sounds:  Normal breath sounds.  Musculoskeletal:        General: Normal range of motion.  Skin:    General: Skin is warm and dry.  Neurological:     General: No focal deficit present.     Mental Status: She is alert and oriented to person, place, and time. Mental status is at baseline.  Psychiatric:        Mood and Affect: Mood normal.        Behavior: Behavior normal.        Thought Content: Thought content normal.        Judgment: Judgment normal.    Lab Results  Component Value Date   HGBA1C 5.7 (H) 11/10/2022   HGBA1C 6.4 05/18/2022   HGBA1C 5.7 10/20/2021    Lab Results  Component Value Date   CREATININE 0.69 11/10/2022   CREATININE 0.70 05/18/2022   CREATININE 0.73 10/20/2021    Lab Results  Component Value Date   WBC  7.5 05/18/2022   HGB 12.1 05/18/2022   HCT 38.2 05/18/2022   PLT 225.0 05/18/2022   GLUCOSE 79 11/10/2022   CHOL 179 05/18/2022   TRIG 62.0 05/18/2022   HDL 55.90 05/18/2022   LDLDIRECT 109.0 05/18/2022   LDLCALC 111 (H) 05/18/2022   ALT 13 11/10/2022   AST 18 11/10/2022   NA 139 11/10/2022   K 4.3 11/10/2022   CL 105 11/10/2022   CREATININE 0.69 11/10/2022   BUN 10 11/10/2022   CO2 25 11/10/2022   TSH 1.74 11/22/2022   HGBA1C 5.7 (H) 11/10/2022   MICROALBUR <0.7 10/20/2019    MM 3D SCREENING MAMMOGRAM BILATERAL BREAST  Result Date: 01/29/2023 CLINICAL DATA:  Screening. EXAM: DIGITAL SCREENING BILATERAL MAMMOGRAM WITH TOMOSYNTHESIS AND CAD TECHNIQUE: Bilateral screening digital craniocaudal and mediolateral oblique mammograms were obtained. Bilateral screening digital breast tomosynthesis was performed. The images were evaluated with computer-aided detection. COMPARISON:  Previous exam(s). ACR Breast Density Category c: The breasts are heterogeneously dense, which may obscure small masses. FINDINGS: There are no findings suspicious for malignancy. IMPRESSION: No mammographic evidence of malignancy. A result letter of this screening mammogram will be mailed directly to the patient. RECOMMENDATION: Screening mammogram in one year. (Code:SM-B-01Y) BI-RADS CATEGORY  1: Negative. Electronically Signed   By: Meda Klinefelter M.D.   On: 01/29/2023 12:32    Assessment & Plan:  .Hyperlipidemia with target LDL less than 160 -     Lipid panel; Future -     LDL cholesterol, direct; Future  Obesity (BMI 30.0-34.9) Assessment & Plan: Counselling on Diet and exercise given.  She was successful in losing weight with GL 1 agonist,  but had to stop due to cost.  She would like to resume therapy .  Patient was given websites for Zepbound and compounded GLP 1 agonists.   Orders: -     CBC with Differential/Platelet; Future  Prediabetes -     Comprehensive metabolic panel; Future -      Hemoglobin A1c; Future -     Microalbumin / creatinine urine ratio; Future  Numbness and tingling in both hands Assessment & Plan: R > L.  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    Orders: -     NCV with EMG(electromyography); Future -     DG Cervical Spine  Complete; Future  Perimenopause Assessment & Plan: She has had irregular periods over the last year that tend to normalize with use of GLP 1 agonist suggestive of PCOS; however,  FSH And LH are suggestive of menopause .    Benign paroxysmal positional vertigo, unspecified laterality  Other orders -     predniSONE; Take 6 tablets by mouth on Day 1 , then reduce by 1 tablet daily until gone  Dispense: 21 tablet; Refill: 0     I provided 30 minutes of face-to-face time during this encounter reviewing patient's last visit with me, patient's  most recent visit with cardiology,  nephrology,  and neurology,  recent surgical and non surgical procedures, previous  labs and imaging studies, counseling on currently addressed issues,  and post visit ordering to diagnostics and therapeutics .   Follow-up: No follow-ups on file.   Sherlene Shams, MD

## 2023-05-25 NOTE — Patient Instructions (Signed)
Prednisone taper    let me know If it works  Nerve conduction studies are likely in your future  [employer] after new year for [employer].  Let me know when your're ready  Return for fasting labs  (4 hr fast is PLENTY)

## 2023-05-27 DIAGNOSIS — R200 Anesthesia of skin: Secondary | ICD-10-CM | POA: Insufficient documentation

## 2023-05-27 DIAGNOSIS — H8110 Benign paroxysmal vertigo, unspecified ear: Secondary | ICD-10-CM | POA: Insufficient documentation

## 2023-05-27 NOTE — Assessment & Plan Note (Signed)
Counselling on Diet and exercise given.  She was successful in losing weight with GL 1 agonist,  but had to stop due to cost.  She would like to resume therapy .  Patient was given websites for Zepbound and compounded GLP 1 agonists.

## 2023-05-27 NOTE — Assessment & Plan Note (Addendum)
R > L.  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

## 2023-05-27 NOTE — Assessment & Plan Note (Signed)
She has had irregular periods over the last year that tend to normalize with use of GLP 1 agonist suggestive of PCOS; however,  FSH And LH are suggestive of menopause .

## 2023-05-28 ENCOUNTER — Other Ambulatory Visit (INDEPENDENT_AMBULATORY_CARE_PROVIDER_SITE_OTHER): Payer: Commercial Managed Care - PPO

## 2023-05-28 DIAGNOSIS — E785 Hyperlipidemia, unspecified: Secondary | ICD-10-CM

## 2023-05-28 DIAGNOSIS — E66811 Obesity, class 1: Secondary | ICD-10-CM

## 2023-05-28 DIAGNOSIS — D649 Anemia, unspecified: Secondary | ICD-10-CM

## 2023-05-28 DIAGNOSIS — R7303 Prediabetes: Secondary | ICD-10-CM | POA: Diagnosis not present

## 2023-05-29 LAB — LIPID PANEL
Cholesterol: 239 mg/dL — ABNORMAL HIGH (ref 0–200)
HDL: 63.9 mg/dL (ref >39.00)
LDL Cholesterol: 137 mg/dL — ABNORMAL HIGH (ref 0–99)
NonHDL: 175.38
Total CHOL/HDL Ratio: 4
Triglycerides: 192 mg/dL — ABNORMAL HIGH (ref 0.0–149.0)
VLDL: 38.4 mg/dL (ref 0.0–40.0)

## 2023-05-29 LAB — LDL CHOLESTEROL, DIRECT: Direct LDL: 140 mg/dL

## 2023-05-29 LAB — CBC WITH DIFFERENTIAL/PLATELET
Basophils Absolute: 0.1 10*3/uL (ref 0.0–0.1)
Basophils Relative: 0.4 % (ref 0.0–3.0)
Eosinophils Absolute: 0 10*3/uL (ref 0.0–0.7)
Eosinophils Relative: 0.1 % (ref 0.0–5.0)
HCT: 36 % (ref 36.0–46.0)
Hemoglobin: 11.3 g/dL — ABNORMAL LOW (ref 12.0–15.0)
Lymphocytes Relative: 25.3 % (ref 12.0–46.0)
Lymphs Abs: 3.6 10*3/uL (ref 0.7–4.0)
MCHC: 31.5 g/dL (ref 30.0–36.0)
MCV: 69.4 fL — ABNORMAL LOW (ref 78.0–100.0)
Monocytes Absolute: 0.9 10*3/uL (ref 0.1–1.0)
Monocytes Relative: 6.3 % (ref 3.0–12.0)
Neutro Abs: 9.7 10*3/uL — ABNORMAL HIGH (ref 1.4–7.7)
Neutrophils Relative %: 67.9 % (ref 43.0–77.0)
Platelets: 356 10*3/uL (ref 150.0–400.0)
RBC: 5.18 Mil/uL — ABNORMAL HIGH (ref 3.87–5.11)
RDW: 15 % (ref 11.5–15.5)
WBC: 14.2 10*3/uL — ABNORMAL HIGH (ref 4.0–10.5)

## 2023-05-29 LAB — MICROALBUMIN / CREATININE URINE RATIO
Creatinine,U: 140 mg/dL
Microalb Creat Ratio: 0.7 mg/g (ref 0.0–30.0)
Microalb, Ur: 0.9 mg/dL (ref 0.0–1.9)

## 2023-05-29 LAB — COMPREHENSIVE METABOLIC PANEL
ALT: 13 U/L (ref 0–35)
AST: 15 U/L (ref 0–37)
Albumin: 4.2 g/dL (ref 3.5–5.2)
Alkaline Phosphatase: 63 U/L (ref 39–117)
BUN: 16 mg/dL (ref 6–23)
CO2: 25 meq/L (ref 19–32)
Calcium: 9.1 mg/dL (ref 8.4–10.5)
Chloride: 101 meq/L (ref 96–112)
Creatinine, Ser: 0.76 mg/dL (ref 0.40–1.20)
GFR: 93.17 mL/min (ref >60.00)
Glucose, Bld: 83 mg/dL (ref 70–99)
Potassium: 3.8 meq/L (ref 3.5–5.1)
Sodium: 137 meq/L (ref 135–145)
Total Bilirubin: 0.4 mg/dL (ref 0.2–1.2)
Total Protein: 7.5 g/dL (ref 6.0–8.3)

## 2023-05-29 LAB — HEMOGLOBIN A1C: Hgb A1c MFr Bld: 6.4 % (ref 4.6–6.5)

## 2023-05-29 NOTE — Addendum Note (Signed)
Addended by: Sherlene Shams on: 05/29/2023 09:05 PM   Modules accepted: Orders

## 2023-05-30 ENCOUNTER — Encounter: Payer: Self-pay | Admitting: Internal Medicine

## 2023-05-31 ENCOUNTER — Other Ambulatory Visit: Payer: Commercial Managed Care - PPO

## 2023-06-01 ENCOUNTER — Other Ambulatory Visit (INDEPENDENT_AMBULATORY_CARE_PROVIDER_SITE_OTHER): Payer: Commercial Managed Care - PPO

## 2023-06-01 DIAGNOSIS — D649 Anemia, unspecified: Secondary | ICD-10-CM | POA: Diagnosis not present

## 2023-06-01 LAB — CBC WITH DIFFERENTIAL/PLATELET
Basophils Absolute: 0 10*3/uL (ref 0.0–0.1)
Basophils Relative: 0.3 % (ref 0.0–3.0)
Eosinophils Absolute: 0.1 10*3/uL (ref 0.0–0.7)
Eosinophils Relative: 1.2 % (ref 0.0–5.0)
HCT: 38.3 % (ref 36.0–46.0)
Hemoglobin: 12 g/dL (ref 12.0–15.0)
Lymphocytes Relative: 29.9 % (ref 12.0–46.0)
Lymphs Abs: 3.3 10*3/uL (ref 0.7–4.0)
MCHC: 31.3 g/dL (ref 30.0–36.0)
MCV: 69.9 fL — ABNORMAL LOW (ref 78.0–100.0)
Monocytes Absolute: 0.6 10*3/uL (ref 0.1–1.0)
Monocytes Relative: 6 % (ref 3.0–12.0)
Neutro Abs: 6.8 10*3/uL (ref 1.4–7.7)
Neutrophils Relative %: 62.6 % (ref 43.0–77.0)
Platelets: 397 10*3/uL (ref 150.0–400.0)
RBC: 5.49 Mil/uL — ABNORMAL HIGH (ref 3.87–5.11)
RDW: 15.4 % (ref 11.5–15.5)
WBC: 10.9 10*3/uL — ABNORMAL HIGH (ref 4.0–10.5)

## 2023-06-02 LAB — IRON,TIBC AND FERRITIN PANEL
%SAT: 16 % (ref 16–45)
Ferritin: 10 ng/mL — ABNORMAL LOW (ref 16–232)
Iron: 62 ug/dL (ref 40–190)
TIBC: 389 ug/dL (ref 250–450)

## 2023-06-18 ENCOUNTER — Other Ambulatory Visit: Payer: Self-pay

## 2023-06-22 ENCOUNTER — Encounter: Payer: Self-pay | Admitting: Internal Medicine

## 2023-06-26 ENCOUNTER — Other Ambulatory Visit: Payer: Self-pay | Admitting: Internal Medicine

## 2023-06-26 MED ORDER — TIRZEPATIDE 2.5 MG/0.5ML ~~LOC~~ SOAJ: SUBCUTANEOUS | 2 refills | Status: DC

## 2023-10-01 ENCOUNTER — Encounter: Payer: Self-pay | Admitting: Internal Medicine

## 2023-10-02 NOTE — Telephone Encounter (Signed)
 If okay I will send in to [facility]

## 2023-10-03 MED ORDER — TIRZEPATIDE-WEIGHT MANAGEMENT 2.5 MG/0.5ML ~~LOC~~ SOLN: 2.5000 mg | SUBCUTANEOUS | 0 refills | Status: DC

## 2023-10-22 ENCOUNTER — Other Ambulatory Visit: Payer: Self-pay | Admitting: Internal Medicine

## 2023-11-07 ENCOUNTER — Encounter: Payer: Self-pay | Admitting: Internal Medicine

## 2023-11-08 MED ORDER — ZEPBOUND 2.5 MG/0.5ML ~~LOC~~ SOLN: 5.0000 mg | SUBCUTANEOUS | 0 refills | Status: DC

## 2023-11-30 ENCOUNTER — Encounter: Payer: Self-pay | Admitting: Internal Medicine

## 2023-11-30 ENCOUNTER — Ambulatory Visit: Admitting: Internal Medicine

## 2023-11-30 VITALS — BP 108/70 | HR 76 | Ht 61.5 in | Wt 156.4 lb

## 2023-11-30 DIAGNOSIS — N951 Menopausal and female climacteric states: Secondary | ICD-10-CM

## 2023-11-30 DIAGNOSIS — Z1231 Encounter for screening mammogram for malignant neoplasm of breast: Secondary | ICD-10-CM

## 2023-11-30 DIAGNOSIS — E785 Hyperlipidemia, unspecified: Secondary | ICD-10-CM

## 2023-11-30 DIAGNOSIS — E66811 Obesity, class 1: Secondary | ICD-10-CM

## 2023-11-30 DIAGNOSIS — Z113 Encounter for screening for infections with a predominantly sexual mode of transmission: Secondary | ICD-10-CM | POA: Diagnosis not present

## 2023-11-30 DIAGNOSIS — R000 Tachycardia, unspecified: Secondary | ICD-10-CM | POA: Diagnosis not present

## 2023-11-30 DIAGNOSIS — Z79899 Other long term (current) drug therapy: Secondary | ICD-10-CM | POA: Diagnosis not present

## 2023-11-30 DIAGNOSIS — E611 Iron deficiency: Secondary | ICD-10-CM

## 2023-11-30 DIAGNOSIS — R7303 Prediabetes: Secondary | ICD-10-CM

## 2023-11-30 LAB — CBC WITH DIFFERENTIAL/PLATELET
Basophils Absolute: 0 10*3/uL (ref 0.0–0.1)
Basophils Relative: 0.4 % (ref 0.0–3.0)
Eosinophils Absolute: 0.1 10*3/uL (ref 0.0–0.7)
Eosinophils Relative: 1.7 % (ref 0.0–5.0)
HCT: 40.1 % (ref 36.0–46.0)
Hemoglobin: 12.7 g/dL (ref 12.0–15.0)
Lymphocytes Relative: 32.7 % (ref 12.0–46.0)
Lymphs Abs: 1.9 10*3/uL (ref 0.7–4.0)
MCHC: 31.7 g/dL (ref 30.0–36.0)
MCV: 67.5 fl — ABNORMAL LOW (ref 78.0–100.0)
Monocytes Absolute: 0.4 10*3/uL (ref 0.1–1.0)
Monocytes Relative: 7.1 % (ref 3.0–12.0)
Neutro Abs: 3.4 10*3/uL (ref 1.4–7.7)
Neutrophils Relative %: 58.1 % (ref 43.0–77.0)
Platelets: 298 10*3/uL (ref 150.0–400.0)
RBC: 5.95 Mil/uL — ABNORMAL HIGH (ref 3.87–5.11)
RDW: 15.7 % — ABNORMAL HIGH (ref 11.5–15.5)
WBC: 5.9 10*3/uL (ref 4.0–10.5)

## 2023-11-30 LAB — COMPREHENSIVE METABOLIC PANEL WITH GFR
ALT: 11 U/L (ref 0–35)
AST: 15 U/L (ref 0–37)
Albumin: 4.4 g/dL (ref 3.5–5.2)
Alkaline Phosphatase: 67 U/L (ref 39–117)
BUN: 10 mg/dL (ref 6–23)
CO2: 29 meq/L (ref 19–32)
Calcium: 9.2 mg/dL (ref 8.4–10.5)
Chloride: 105 meq/L (ref 96–112)
Creatinine, Ser: 0.72 mg/dL (ref 0.40–1.20)
GFR: 99.06 mL/min (ref >60.00)
Glucose, Bld: 83 mg/dL (ref 70–99)
Potassium: 4.1 meq/L (ref 3.5–5.1)
Sodium: 140 meq/L (ref 135–145)
Total Bilirubin: 0.4 mg/dL (ref 0.2–1.2)
Total Protein: 7.8 g/dL (ref 6.0–8.3)

## 2023-11-30 LAB — LIPID PANEL
Cholesterol: 213 mg/dL — ABNORMAL HIGH (ref 0–200)
HDL: 58.3 mg/dL (ref >39.00)
LDL Cholesterol: 140 mg/dL — ABNORMAL HIGH (ref 0–99)
NonHDL: 154.37
Total CHOL/HDL Ratio: 4
Triglycerides: 73 mg/dL (ref 0.0–149.0)
VLDL: 14.6 mg/dL (ref 0.0–40.0)

## 2023-11-30 LAB — LDL CHOLESTEROL, DIRECT: Direct LDL: 138 mg/dL

## 2023-11-30 LAB — HEMOGLOBIN A1C: Hgb A1c MFr Bld: 5.9 % (ref 4.6–6.5)

## 2023-11-30 MED ORDER — SEMAGLUTIDE-WEIGHT MANAGEMENT 0.5 MG/0.5ML ~~LOC~~ SOAJ: 0.5000 mg | SUBCUTANEOUS | 2 refills | Status: DC

## 2023-11-30 NOTE — Assessment & Plan Note (Signed)
 SHE IS losing weight on Zepbound  5 g dose but paying out of pocket.  Rx for [facility] given for alternative if overed by [employer] .  Walking daily for exercise

## 2023-11-30 NOTE — Assessment & Plan Note (Signed)
 Resolved with weight loss and improved conditioning

## 2023-11-30 NOTE — Progress Notes (Signed)
 Subjective:  Patient ID: Amy Lara, female    DOB: Oct 10, 1975  Age: 48 y.o. MRN: [id number]  CC: The primary encounter diagnosis was Hyperlipidemia with target LDL less than 160. Diagnoses of Prediabetes, Long-term use of high-risk medication, Encounter for screening mammogram for malignant neoplasm of breast, Iron deficiency, Screen for STD (sexually transmitted disease), Obesity (BMI 30.0-34.9), Tachycardia, unspecified, and Perimenopause were also pertinent to this visit.   HPI Zakeria Kartchner presents for  Chief Complaint  Patient presents with   Medical Management of Chronic Issues    6 month follow up    1) weight management : has been taking Zepbound  2.5 mg dose ; 20 lb weight loss since November .  5 lbs lost in the las months  since increasing  the dose to 5 mg .  She is walking 1.5 to 2 miles daily.   2) perimenopause :  has had 2 periods since Decemeber ( had no periods for 2 years)  strong FH of PCOS but never diagnosed   3)     Outpatient Medications Prior to Visit  Medication Sig Dispense Refill   diphenhydrAMINE HCl (BENADRYL PO) Take by mouth as needed.     famotidine (PEPCID) 20 MG tablet Take 20 mg by mouth 2 (two) times daily. Patient confirmed she takes this once daily     Melatonin 10 MG TABS Take 1 tablet by mouth daily as needed.     Simethicone (GAS RELIEF 125 MAX ST PO) Take 1 capsule by mouth daily at 2 am.     tirzepatide  (ZEPBOUND ) 2.5 MG/0.5ML injection vial Inject 5 mg into the skin once a week. 12 mL 0   predniSONE  (DELTASONE ) 10 MG tablet Take 6 tablets by mouth on Day 1 , then reduce by 1 tablet daily until gone (Patient not taking: Reported on 11/30/2023) 21 tablet 0   No facility-administered medications prior to visit.    Review of Systems;  Patient denies headache, fevers, malaise, unintentional weight loss, skin rash, eye pain, sinus congestion and sinus pain, sore throat, dysphagia,  hemoptysis , cough, dyspnea, wheezing, chest pain,  palpitations, orthopnea, edema, abdominal pain, nausea, melena, diarrhea, constipation, flank pain, dysuria, hematuria, urinary  Frequency, nocturia, numbness, tingling, seizures,  Focal weakness, Loss of consciousness,  Tremor, insomnia, depression, anxiety, and suicidal ideation.      Objective:  BP 108/70   Pulse 76   Ht 5' 1.5" (1.562 m)   Wt 156 lb 6.4 oz (70.9 kg)   SpO2 97%   BMI 29.07 kg/m   BP Readings from Last 3 Encounters:  11/30/23 108/70  05/25/23 118/70  11/22/22 122/68    Wt Readings from Last 3 Encounters:  11/30/23 156 lb 6.4 oz (70.9 kg)  05/25/23 169 lb 6.4 oz (76.8 kg)  11/22/22 165 lb 3.2 oz (74.9 kg)    Physical Exam Vitals reviewed.  Constitutional:      General: She is not in acute distress.    Appearance: Normal appearance. She is normal weight. She is not ill-appearing, toxic-appearing or diaphoretic.  HENT:     Head: Normocephalic.  Eyes:     General: No scleral icterus.       Right eye: No discharge.        Left eye: No discharge.     Conjunctiva/sclera: Conjunctivae normal.  Cardiovascular:     Rate and Rhythm: Normal rate and regular rhythm.     Heart sounds: Normal heart sounds.  Pulmonary:     Effort: Pulmonary  effort is normal. No respiratory distress.     Breath sounds: Normal breath sounds.  Musculoskeletal:        General: Normal range of motion.  Skin:    General: Skin is warm and dry.  Neurological:     General: No focal deficit present.     Mental Status: She is alert and oriented to person, place, and time. Mental status is at baseline.  Psychiatric:        Mood and Affect: Mood normal.        Behavior: Behavior normal.        Thought Content: Thought content normal.        Judgment: Judgment normal.    Lab Results  Component Value Date   HGBA1C 6.4 05/28/2023   HGBA1C 5.7 (H) 11/10/2022   HGBA1C 6.4 05/18/2022    Lab Results  Component Value Date   CREATININE 0.76 05/28/2023   CREATININE 0.69 11/10/2022    CREATININE 0.70 05/18/2022    Lab Results  Component Value Date   WBC 10.9 (H) 06/01/2023   HGB 12.0 06/01/2023   HCT 38.3 06/01/2023   PLT 397.0 06/01/2023   GLUCOSE 83 05/28/2023   CHOL 239 (H) 05/28/2023   TRIG 192.0 (H) 05/28/2023   HDL 63.90 05/28/2023   LDLDIRECT 140.0 05/28/2023   LDLCALC 137 (H) 05/28/2023   ALT 13 05/28/2023   AST 15 05/28/2023   NA 137 05/28/2023   K 3.8 05/28/2023   CL 101 05/28/2023   CREATININE 0.76 05/28/2023   BUN 16 05/28/2023   CO2 25 05/28/2023   TSH 1.74 11/22/2022   HGBA1C 6.4 05/28/2023   MICROALBUR 0.9 05/28/2023    MM 3D SCREENING MAMMOGRAM BILATERAL BREAST Result Date: 01/29/2023 CLINICAL DATA:  Screening. EXAM: DIGITAL SCREENING BILATERAL MAMMOGRAM WITH TOMOSYNTHESIS AND CAD TECHNIQUE: Bilateral screening digital craniocaudal and mediolateral oblique mammograms were obtained. Bilateral screening digital breast tomosynthesis was performed. The images were evaluated with computer-aided detection. COMPARISON:  Previous exam(s). ACR Breast Density Category c: The breasts are heterogeneously dense, which may obscure small masses. FINDINGS: There are no findings suspicious for malignancy. IMPRESSION: No mammographic evidence of malignancy. A result letter of this screening mammogram will be mailed directly to the patient. RECOMMENDATION: Screening mammogram in one year. (Code:SM-B-01Y) BI-RADS CATEGORY  1: Negative. Electronically Signed   By: Clancy Crimes M.D.   On: 01/29/2023 12:32    Assessment & Plan:  .Hyperlipidemia with target LDL less than 160 -     Lipid panel -     LDL cholesterol, direct  Prediabetes -     Hemoglobin A1c -     Comprehensive metabolic panel with GFR  Long-term use of high-risk medication -     TSH  Encounter for screening mammogram for malignant neoplasm of breast -     3D Screening Mammogram, Left and Right; Future  Iron deficiency -     IBC + Ferritin -     CBC with Differential/Platelet  Screen  for STD (sexually transmitted disease) -     Hepatitis C antibody -     HIV Antibody (routine testing w rflx)  Obesity (BMI 30.0-34.9) Assessment & Plan: SHE IS losing weight on Zepbound  5 g dose but paying out of pocket.  Rx for [facility] given for alternative if overed by [employer] .  Walking daily for exercise    Tachycardia, unspecified Assessment & Plan: Resolved with weight loss and improved conditioning    Perimenopause Assessment & Plan: She has had  irregular periods over the last year that tend to normalize with use of GLP 1 agonist suggestive of PCOS; however,  FSH And LH are suggestive of menopause . She has had 2 menses in the past 5 months.    Other orders -     Semaglutide-Weight Management; Inject 0.5 mg into the skin once a week for 28 days.  Dispense: 2 mL; Refill: 2     Follow-up: No follow-ups on file.   Thersia Flax, MD

## 2023-11-30 NOTE — Assessment & Plan Note (Signed)
 She has had irregular periods over the last year that tend to normalize with use of GLP 1 agonist suggestive of PCOS; however,  FSH And LH are suggestive of menopause . She has had 2 menses in the past 5 months.

## 2023-11-30 NOTE — Patient Instructions (Signed)
 Wegovy MIGHT be less $$$ OOP using the coupon I have given you today.  Let me know going forward  if it is,  and if NOT  I will refill the 5 mg zepbound 

## 2023-12-01 LAB — HEPATITIS C ANTIBODY: Hepatitis C Ab: NONREACTIVE

## 2023-12-01 LAB — HIV ANTIBODY (ROUTINE TESTING W REFLEX): HIV 1&2 Ab, 4th Generation: NONREACTIVE

## 2023-12-02 ENCOUNTER — Ambulatory Visit: Payer: Self-pay | Admitting: Internal Medicine

## 2023-12-03 ENCOUNTER — Encounter: Payer: Self-pay | Admitting: Internal Medicine

## 2023-12-04 LAB — IBC + FERRITIN
Ferritin: 17.6 ng/mL (ref 10.0–291.0)
Iron: 80 ug/dL (ref 42–145)
Saturation Ratios: 20.1 % (ref 20.0–50.0)
TIBC: 397.6 ug/dL (ref 250.0–450.0)
Transferrin: 284 mg/dL (ref 212.0–360.0)

## 2023-12-04 LAB — TSH: TSH: 1.42 u[IU]/mL (ref 0.35–5.50)

## 2023-12-04 MED ORDER — ZEPBOUND 5 MG/0.5ML ~~LOC~~ SOLN
5.0000 mg | SUBCUTANEOUS | 2 refills | Status: DC
Start: 2023-12-04 — End: 2024-03-06

## 2024-01-14 ENCOUNTER — Other Ambulatory Visit: Payer: Self-pay

## 2024-01-14 DIAGNOSIS — N951 Menopausal and female climacteric states: Secondary | ICD-10-CM | POA: Diagnosis not present

## 2024-01-14 MED ORDER — ESTRADIOL 0.1 MG/24HR TD PTTW
1.0000 | MEDICATED_PATCH | TRANSDERMAL | 11 refills | Status: AC
  Filled 2024-01-14: qty 8, 28d supply, fill #0
  Filled 2024-02-26: qty 8, 28d supply, fill #1
  Filled 2024-03-20: qty 8, 28d supply, fill #2
  Filled 2024-04-19: qty 8, 28d supply, fill #3
  Filled 2024-05-16 – 2024-05-21 (×2): qty 8, 28d supply, fill #4
  Filled 2024-06-15: qty 8, 28d supply, fill #5
  Filled 2024-07-17: qty 8, 28d supply, fill #6
  Filled 2024-08-12 – 2024-08-13 (×2): qty 24, 84d supply, fill #7
  Filled 2024-08-13 – 2024-08-21 (×2): qty 24, 84d supply, fill #0
  Filled 2024-08-21: qty 8, 28d supply, fill #0

## 2024-01-14 MED ORDER — PROGESTERONE 200 MG PO CAPS
200.0000 mg | ORAL_CAPSULE | Freq: Every day | ORAL | 11 refills | Status: DC
  Filled 2024-01-14: qty 30, 30d supply, fill #0
  Filled 2024-02-26: qty 30, 30d supply, fill #1

## 2024-01-29 ENCOUNTER — Ambulatory Visit
Admission: RE | Admit: 2024-01-29 | Discharge: 2024-01-29 | Disposition: A | Discharge status: Home / Self Care | Source: Ambulatory Visit | Attending: Internal Medicine | Admitting: Internal Medicine

## 2024-01-29 DIAGNOSIS — Z1231 Encounter for screening mammogram for malignant neoplasm of breast: Secondary | ICD-10-CM | POA: Diagnosis not present

## 2024-03-04 ENCOUNTER — Other Ambulatory Visit: Payer: Self-pay | Admitting: Internal Medicine

## 2024-03-24 ENCOUNTER — Other Ambulatory Visit: Payer: Self-pay

## 2024-03-25 ENCOUNTER — Other Ambulatory Visit: Payer: Self-pay

## 2024-03-25 MED ORDER — PROGESTERONE MICRONIZED 100 MG PO CAPS
100.0000 mg | ORAL_CAPSULE | Freq: Every day | ORAL | 11 refills | Status: AC
  Filled 2024-03-25: qty 30, 30d supply, fill #0
  Filled 2024-04-19: qty 30, 30d supply, fill #1
  Filled 2024-05-16 – 2024-05-21 (×2): qty 30, 30d supply, fill #2
  Filled 2024-06-15: qty 30, 30d supply, fill #3
  Filled 2024-07-17: qty 30, 30d supply, fill #4
  Filled 2024-08-12: qty 30, 30d supply, fill #5
  Filled 2024-08-13: qty 90, 90d supply, fill #0
  Filled 2024-08-13: qty 90, 90d supply, fill #5
  Filled 2024-08-21: qty 90, 90d supply, fill #0
  Filled 2024-08-21: qty 30, 30d supply, fill #0
  Filled 2024-08-22: qty 90, 90d supply, fill #0

## 2024-04-15 DIAGNOSIS — Z79890 Hormone replacement therapy (postmenopausal): Secondary | ICD-10-CM | POA: Diagnosis not present

## 2024-04-15 DIAGNOSIS — N951 Menopausal and female climacteric states: Secondary | ICD-10-CM | POA: Diagnosis not present

## 2024-05-16 ENCOUNTER — Other Ambulatory Visit: Payer: Self-pay

## 2024-05-21 ENCOUNTER — Other Ambulatory Visit: Payer: Self-pay

## 2024-06-03 ENCOUNTER — Encounter: Payer: Self-pay | Admitting: Internal Medicine

## 2024-06-03 ENCOUNTER — Other Ambulatory Visit: Payer: Self-pay | Admitting: *Deleted

## 2024-06-03 DIAGNOSIS — E611 Iron deficiency: Secondary | ICD-10-CM

## 2024-06-03 DIAGNOSIS — R7303 Prediabetes: Secondary | ICD-10-CM

## 2024-06-03 DIAGNOSIS — E785 Hyperlipidemia, unspecified: Secondary | ICD-10-CM

## 2024-06-16 ENCOUNTER — Ambulatory Visit: Admitting: Internal Medicine

## 2024-06-16 ENCOUNTER — Other Ambulatory Visit

## 2024-06-16 ENCOUNTER — Encounter: Payer: Self-pay | Admitting: Internal Medicine

## 2024-06-16 ENCOUNTER — Other Ambulatory Visit: Payer: Self-pay

## 2024-06-16 VITALS — BP 110/72 | HR 100 | Ht 61.5 in | Wt 166.8 lb

## 2024-06-16 DIAGNOSIS — R7303 Prediabetes: Secondary | ICD-10-CM | POA: Diagnosis not present

## 2024-06-16 DIAGNOSIS — R002 Palpitations: Secondary | ICD-10-CM | POA: Diagnosis not present

## 2024-06-16 DIAGNOSIS — E611 Iron deficiency: Secondary | ICD-10-CM | POA: Diagnosis not present

## 2024-06-16 DIAGNOSIS — R87810 Cervical high risk human papillomavirus (HPV) DNA test positive: Secondary | ICD-10-CM

## 2024-06-16 DIAGNOSIS — Z1231 Encounter for screening mammogram for malignant neoplasm of breast: Secondary | ICD-10-CM | POA: Diagnosis not present

## 2024-06-16 DIAGNOSIS — Z0000 Encounter for general adult medical examination without abnormal findings: Secondary | ICD-10-CM | POA: Diagnosis not present

## 2024-06-16 DIAGNOSIS — E66811 Obesity, class 1: Secondary | ICD-10-CM | POA: Diagnosis not present

## 2024-06-16 DIAGNOSIS — E785 Hyperlipidemia, unspecified: Secondary | ICD-10-CM

## 2024-06-16 DIAGNOSIS — D563 Thalassemia minor: Secondary | ICD-10-CM

## 2024-06-16 DIAGNOSIS — Z780 Asymptomatic menopausal state: Secondary | ICD-10-CM

## 2024-06-16 LAB — COMPREHENSIVE METABOLIC PANEL WITH GFR
ALT: 13 U/L (ref 0–35)
AST: 14 U/L (ref 0–37)
Albumin: 4.1 g/dL (ref 3.5–5.2)
Alkaline Phosphatase: 60 U/L (ref 39–117)
BUN: 10 mg/dL (ref 6–23)
CO2: 29 meq/L (ref 19–32)
Calcium: 8.9 mg/dL (ref 8.4–10.5)
Chloride: 102 meq/L (ref 96–112)
Creatinine, Ser: 0.69 mg/dL (ref 0.40–1.20)
GFR: 102.43 mL/min (ref >60.00)
Glucose, Bld: 85 mg/dL (ref 70–99)
Potassium: 4.3 meq/L (ref 3.5–5.1)
Sodium: 138 meq/L (ref 135–145)
Total Bilirubin: 0.3 mg/dL (ref 0.2–1.2)
Total Protein: 7.1 g/dL (ref 6.0–8.3)

## 2024-06-16 LAB — CBC WITH DIFFERENTIAL/PLATELET
Basophils Absolute: 0 K/uL (ref 0.0–0.1)
Basophils Relative: 0.3 % (ref 0.0–3.0)
Eosinophils Absolute: 0.1 K/uL (ref 0.0–0.7)
Eosinophils Relative: 1.5 % (ref 0.0–5.0)
HCT: 37.9 % (ref 36.0–46.0)
Hemoglobin: 12.2 g/dL (ref 12.0–15.0)
Lymphocytes Relative: 30.5 % (ref 12.0–46.0)
Lymphs Abs: 2.3 K/uL (ref 0.7–4.0)
MCHC: 32.1 g/dL (ref 30.0–36.0)
MCV: 69 fl — ABNORMAL LOW (ref 78.0–100.0)
Monocytes Absolute: 0.5 K/uL (ref 0.1–1.0)
Monocytes Relative: 6.6 % (ref 3.0–12.0)
Neutro Abs: 4.6 K/uL (ref 1.4–7.7)
Neutrophils Relative %: 61.1 % (ref 43.0–77.0)
Platelets: 312 K/uL (ref 150.0–400.0)
RBC: 5.49 Mil/uL — ABNORMAL HIGH (ref 3.87–5.11)
RDW: 15.3 % (ref 11.5–15.5)
WBC: 7.6 K/uL (ref 4.0–10.5)

## 2024-06-16 LAB — LIPID PANEL
Cholesterol: 213 mg/dL — ABNORMAL HIGH (ref 0–200)
HDL: 55.3 mg/dL (ref >39.00)
LDL Cholesterol: 117 mg/dL — ABNORMAL HIGH (ref 0–99)
NonHDL: 157.82
Total CHOL/HDL Ratio: 4
Triglycerides: 203 mg/dL — ABNORMAL HIGH (ref 0.0–149.0)
VLDL: 40.6 mg/dL — ABNORMAL HIGH (ref 0.0–40.0)

## 2024-06-16 LAB — HEMOGLOBIN A1C: Hgb A1c MFr Bld: 6.1 % (ref 4.6–6.5)

## 2024-06-16 LAB — LDL CHOLESTEROL, DIRECT: Direct LDL: 127 mg/dL

## 2024-06-16 MED ORDER — ONDANSETRON 4 MG PO TBDP
4.0000 mg | ORAL_TABLET | Freq: Three times a day (TID) | ORAL | 0 refills | Status: AC | PRN
  Filled 2024-06-16: qty 20, 7d supply, fill #0

## 2024-06-16 NOTE — Progress Notes (Unsigned)
 Subjective:  Patient ID: Amy Lara, female    DOB: Dec 01, 1975  Age: 48 y.o. MRN: [id number]  CC: There were no encounter diagnoses.   HPI Amy Lara presents for  Chief Complaint  Patient presents with  . Medical Management of Chronic Issues    6 month follow up    Obesity management:  she notes previous success with pharmacotherapy:  lost 60 lbs on GLP 1 before stopping due to cost.  Has regained 15 lbs,  has enrolled in  [facility] management plan with reconstituted semaglutide  and has purchased a 7.5 mg vialfor $200  .  Future rx to be sent as   Wegovy  plus dose compounded Ok    She has planned to use the starting dose   0.25 mg  for 4 weeks and advance dose to 0.5 mg after 4 weeks   She has nausea immediately after the injection  which is self limited and considered to be psychosomatic.  She vomits immediately even if husband administers the dose.  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  Doing pilates 2 times per week.  Going to add spin at [employer]   Averaging 6 to 7 hours of sleep   Thalassemia:  hgb stable. Sister  diagnosed with Hgb E, alpha thalassemia trait and Hgb F , but also has fatty liver.    Outpatient Medications Prior to Visit  Medication Sig Dispense Refill  . diphenhydrAMINE HCl (BENADRYL PO) Take by mouth as needed.    . estradiol  (VIVELLE -DOT) 0.1 MG/24HR patch Place 1 patch (0.1 mg total) onto the skin 2 (two) times a week. 8 patch 11  . famotidine (PEPCID) 20 MG tablet Take 20 mg by mouth 2 (two) times daily. Patient confirmed she takes this once daily    . Melatonin 10 MG TABS Take 1 tablet by mouth daily as needed.    . progesterone  (PROMETRIUM ) 100 MG capsule Take 1 capsule (100 mg total) by mouth daily. 30 capsule 11  . Simethicone (GAS RELIEF 125 MAX ST PO) Take 1 capsule by mouth daily at 2 am.    . ZEPBOUND  5 MG/0.5ML injection vial INJECT 0.5 ML (5 MG) UNDER THE SKIN ONCE WEEKLY (0.5ML= 50 UNITS) 2  mL 2  . progesterone  (PROMETRIUM ) 200 MG capsule Take 1 capsule (200 mg total) by mouth at bedtime. (Patient not taking: Reported on 06/16/2024) 30 capsule 11   No facility-administered medications prior to visit.    Review of Systems;  Patient denies headache, fevers, malaise, unintentional weight loss, skin rash, eye pain, sinus congestion and sinus pain, sore throat, dysphagia,  hemoptysis , cough, dyspnea, wheezing, chest pain, palpitations, orthopnea, edema, abdominal pain, nausea, melena, diarrhea, constipation, flank pain, dysuria, hematuria, urinary  Frequency, nocturia, numbness, tingling, seizures,  Focal weakness, Loss of consciousness,  Tremor, insomnia, depression, anxiety, and suicidal ideation.      Objective:  BP 110/72   Pulse 100   Ht 5' 1.5 (1.562 m)   Wt 166 lb 12.8 oz (75.7 kg)   SpO2 97%   BMI 31.01 kg/m   BP Readings from Last 3 Encounters:  06/16/24 110/72  11/30/23 108/70  05/25/23 118/70    Wt Readings from Last 3 Encounters:  06/16/24 166 lb 12.8 oz (75.7 kg)  11/30/23 156 lb 6.4 oz (70.9 kg)  05/25/23 169 lb 6.4 oz (76.8 kg)    Physical Exam Vitals reviewed.  Constitutional:  General: She is not in acute distress.    Appearance: Normal appearance. She is normal weight. She is not ill-appearing, toxic-appearing or diaphoretic.  HENT:     Head: Normocephalic.  Eyes:     General: No scleral icterus.       Right eye: No discharge.        Left eye: No discharge.     Conjunctiva/sclera: Conjunctivae normal.  Cardiovascular:     Rate and Rhythm: Normal rate and regular rhythm.     Heart sounds: Normal heart sounds.  Pulmonary:     Effort: Pulmonary effort is normal. No respiratory distress.     Breath sounds: Normal breath sounds.  Chest:  Breasts:    Breasts are symmetrical.     Right: Normal.     Left: Normal.  Abdominal:     Hernia: There is no hernia in the left inguinal area or right inguinal area.  Genitourinary:    Exam  position: Lithotomy position.     Pubic Area: No rash or pubic lice.      Labia:        Right: No rash, tenderness, lesion or injury.        Left: No rash, tenderness, lesion or injury.      Vagina: Normal.     Cervix: Normal.     Uterus: Normal.      Adnexa: Right adnexa normal and left adnexa normal.  Musculoskeletal:        General: Normal range of motion.  Lymphadenopathy:     Upper Body:     Right upper body: No supraclavicular, axillary or pectoral adenopathy.     Left upper body: No supraclavicular, axillary or pectoral adenopathy.     Lower Body: No right inguinal adenopathy. No left inguinal adenopathy.  Skin:    General: Skin is warm and dry.  Neurological:     General: No focal deficit present.     Mental Status: She is alert and oriented to person, place, and time. Mental status is at baseline.  Psychiatric:        Mood and Affect: Mood normal.        Behavior: Behavior normal.        Thought Content: Thought content normal.        Judgment: Judgment normal.    Lab Results  Component Value Date   HGBA1C 6.1 06/16/2024   HGBA1C 5.9 11/30/2023   HGBA1C 6.4 05/28/2023    Lab Results  Component Value Date   CREATININE 0.69 06/16/2024   CREATININE 0.72 11/30/2023   CREATININE 0.76 05/28/2023    Lab Results  Component Value Date   WBC 7.6 06/16/2024   HGB 12.2 06/16/2024   HCT 37.9 06/16/2024   PLT 312.0 06/16/2024   GLUCOSE 85 06/16/2024   CHOL 213 (H) 06/16/2024   TRIG 203.0 (H) 06/16/2024   HDL 55.30 06/16/2024   LDLDIRECT 127.0 06/16/2024   LDLCALC 117 (H) 06/16/2024   ALT 13 06/16/2024   AST 14 06/16/2024   NA 138 06/16/2024   K 4.3 06/16/2024   CL 102 06/16/2024   CREATININE 0.69 06/16/2024   BUN 10 06/16/2024   CO2 29 06/16/2024   TSH 1.42 11/30/2023   HGBA1C 6.1 06/16/2024    MM 3D SCREENING MAMMOGRAM BILATERAL BREAST Result Date: 02/01/2024 CLINICAL DATA:  Screening. EXAM: DIGITAL SCREENING BILATERAL MAMMOGRAM WITH TOMOSYNTHESIS AND  CAD TECHNIQUE: Bilateral screening digital craniocaudal and mediolateral oblique mammograms were obtained. Bilateral screening digital breast tomosynthesis was performed.  The images were evaluated with computer-aided detection. COMPARISON:  Previous exam(s). ACR Breast Density Category c: The breasts are heterogeneously dense, which may obscure small masses. FINDINGS: There are no findings suspicious for malignancy. IMPRESSION: No mammographic evidence of malignancy. A result letter of this screening mammogram will be mailed directly to the patient. RECOMMENDATION: Screening mammogram in one year. (Code:SM-B-01Y) BI-RADS CATEGORY  1: Negative. Electronically Signed   By: Inocente Ast M.D.   On: 02/01/2024 10:56    Assessment & Plan:  .There are no diagnoses linked to this encounter.   I spent 34 minutes on the day of this face to face encounter reviewing patient's  most recent visit with cardiology,  nephrology,  and neurology,  prior relevant surgical and non surgical procedures, recent  labs and imaging studies, counseling on weight management,  reviewing the assessment and plan with patient, and post visit ordering and reviewing of  diagnostics and therapeutics with patient  .   Follow-up: No follow-ups on file.   Verneita LITTIE Kettering, MD

## 2024-06-17 ENCOUNTER — Ambulatory Visit: Payer: Self-pay | Admitting: Internal Medicine

## 2024-06-17 DIAGNOSIS — R7303 Prediabetes: Secondary | ICD-10-CM | POA: Insufficient documentation

## 2024-06-17 DIAGNOSIS — Z780 Asymptomatic menopausal state: Secondary | ICD-10-CM | POA: Insufficient documentation

## 2024-06-17 NOTE — Assessment & Plan Note (Signed)
 Repeat CBC done, no anemia,  Just microcytosis  Lab Results  Component Value Date   WBC 7.6 06/16/2024   HGB 12.2 06/16/2024   HCT 37.9 06/16/2024   MCV 69.0 (L) 06/16/2024   PLT 312.0 06/16/2024

## 2024-06-17 NOTE — Assessment & Plan Note (Signed)
 Continue follow up with [place] [facility]  last OV Sept 2025.

## 2024-06-17 NOTE — Assessment & Plan Note (Signed)
 Continue annual mammograms, last one July 2025.  She does her own breast exams monthly.

## 2024-06-17 NOTE — Assessment & Plan Note (Signed)

## 2024-06-17 NOTE — Assessment & Plan Note (Signed)
 Her  random glucose is not  elevated but her A1c has been suggestive of increased  risk for developing diabetes.   She is following  a low glycemic index diet and particpating regularly in an aerobic  exercise activity.  We should check an A1c in 6 months.     Lab Results  Component Value Date   HGBA1C 6.1 06/16/2024

## 2024-06-17 NOTE — Assessment & Plan Note (Signed)
 Resuming GLP 1 agonist therapy with reconstituting semaglutide . Currently at 0.25 mg weekly dose.  Obtained via [employer]

## 2024-06-17 NOTE — Assessment & Plan Note (Signed)
 HRT managed by Dr Verdon

## 2024-06-17 NOTE — Progress Notes (Signed)
 Patient ID: Amy Lara, female    DOB: 1976-01-08  Age: 48 y.o. MRN: [id number]  The patient is here for annual preventive examination and management of other chronic and acute problems.   The risk factors are reflected in the social history.   The roster of all physicians providing medical care to patient - is listed in the Snapshot section of the chart.   Activities of daily living:  The patient is 100% independent in all ADLs: dressing, toileting, feeding as well as independent mobility   Home safety : The patient has smoke detectors in the home. They wear seatbelts.  There are no unsecured firearms at home. There is no violence in the home.    There is no risks for hepatitis, STDs or HIV. There is no   history of blood transfusion. They have no travel history to infectious disease endemic areas of the world.   The patient has seen their dentist in the last six month. They have seen their eye doctor in the last year. The patinet  denies slight hearing difficulty with regard to whispered voices and some television programs.  They have deferred audiologic testing in the last year.  They do not  have excessive sun exposure. Discussed the need for sun protection: hats, long sleeves and use of sunscreen if there is significant sun exposure.    Diet: the importance of a healthy diet is discussed. They do have a healthy diet.   The benefits of regular aerobic exercise were discussed. The patient  exercises  2  days per week  for  60 minutes.    Depression screen: there are no signs or vegative symptoms of depression- irritability, change in appetite, anhedonia, sadness/tearfullness.   The following portions of the patient's history were reviewed and updated as appropriate: allergies, current medications, past family history, past medical history,  past surgical history, past social history  and problem list.   Visual acuity was not assessed per patient preference since the patient has regular  follow up with an  ophthalmologist. Hearing and body mass index were assessed and reviewed.    During the course of the visit the patient was educated and counseled about appropriate screening and preventive services including : fall prevention , diabetes screening, nutrition counseling, colorectal cancer screening, and recommended immunizations.    Chief Complaint:     Obesity management:  she notes previous success with pharmacotherapy:  lost 60 lbs on GLP 1 before stopping due to cost.  Has regained 15 lbs,  has enrolled in  [facility] management plan with reconstituted semaglutide  and has purchased a 7.5 mg vialfor $200  .  Future rx to be sent as   Wegovy  plus dose compounded Ok    She has planned to use the starting dose   0.25 mg  for 4 weeks and advance dose to 0.5 mg after 4 weeks   She has nausea immediately after the injection  which is self limited and considered to be psychosomatic.  She vomits immediately even if husband administers the dose.  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  Doing pilates 2 times per week.  Going to add spin at [employer]   Averaging 6 to 7 hours of sleep   Thalassemia:  hgb stable. Sister  diagnosed with Hgb E, alpha thalassemia trait and Hgb F , but also has fatty liver.   Review of Symptoms  Patient denies headache, fevers,  malaise, unintentional weight loss, skin rash, eye pain, sinus congestion and sinus pain, sore throat, dysphagia,  hemoptysis , cough, dyspnea, wheezing, chest pain, palpitations, orthopnea, edema, abdominal pain, nausea, melena, diarrhea, constipation, flank pain, dysuria, hematuria, urinary  Frequency, nocturia, numbness, tingling, seizures,  Focal weakness, Loss of consciousness,  Tremor, insomnia, depression, anxiety, and suicidal ideation.    Physical Exam:  BP 110/72   Pulse 100   Ht 5' 1.5 (1.562 m)   Wt 166 lb 12.8 oz (75.7 kg)   SpO2 97%   BMI 31.01 kg/m    Physical  Exam Vitals reviewed.  Constitutional:      General: She is not in acute distress.    Appearance: Normal appearance. She is normal weight. She is not ill-appearing, toxic-appearing or diaphoretic.  HENT:     Head: Normocephalic.  Eyes:     General: No scleral icterus.       Right eye: No discharge.        Left eye: No discharge.     Conjunctiva/sclera: Conjunctivae normal.  Cardiovascular:     Rate and Rhythm: Normal rate and regular rhythm.     Heart sounds: Normal heart sounds.  Pulmonary:     Effort: Pulmonary effort is normal. No respiratory distress.     Breath sounds: Normal breath sounds.  Musculoskeletal:        General: Normal range of motion.  Skin:    General: Skin is warm and dry.  Neurological:     General: No focal deficit present.     Mental Status: She is alert and oriented to person, place, and time. Mental status is at baseline.  Psychiatric:        Mood and Affect: Mood normal.        Behavior: Behavior normal.        Thought Content: Thought content normal.        Judgment: Judgment normal.     Assessment and Plan: Encounter for preventive health examination Assessment & Plan: age appropriate education and counseling updated, referrals for preventative services and immunizations addressed, dietary and smoking counseling addressed, most recent labs reviewed.  I have personally reviewed and have noted:   1) the patient's medical and social history 2) The pt's use of alcohol, tobacco, and illicit drugs 3) The patient's current medications and supplements 4) Functional ability including ADL's, fall risk, home safety risk, hearing and visual impairment 5) Diet and physical activities 6) Evidence for depression or mood disorder 7) The patient's height, weight, and BMI have been recorded in the chart    I have made referrals, and provided counseling and education based on review of the above    Palpitations with regular cardiac rhythm -     TSH;  Future -     CBC with Differential/Platelet; Future  Hyperlipidemia with target LDL less than 160 Assessment & Plan: Mild, with LDL < 130  . No indication for treatment .SABRA  Lab Results  Component Value Date   CHOL 213 (H) 06/16/2024   HDL 55.30 06/16/2024   LDLCALC 117 (H) 06/16/2024   LDLDIRECT 127.0 06/16/2024   TRIG 203.0 (H) 06/16/2024   CHOLHDL 4 06/16/2024     Orders: -     Lipid panel; Future  Prediabetes Assessment & Plan: Her  random glucose is not  elevated but her A1c has been suggestive of increased  risk for developing diabetes.   She is following  a low glycemic index diet and particpating regularly in an  aerobic  exercise activity.  We should check an A1c in 6 months.     Lab Results  Component Value Date   HGBA1C 6.1 06/16/2024     Orders: -     Hemoglobin A1c; Future -     Comprehensive metabolic panel with GFR; Future  Thalassemia carrier Assessment & Plan: Repeat CBC done, no anemia,  Just microcytosis  Lab Results  Component Value Date   WBC 7.6 06/16/2024   HGB 12.2 06/16/2024   HCT 37.9 06/16/2024   MCV 69.0 (L) 06/16/2024   PLT 312.0 06/16/2024      Obesity (BMI 30.0-34.9) Assessment & Plan: Resuming GLP 1 agonist therapy with reconstituting semaglutide . Currently at 0.25 mg weekly dose.  Obtained via [facility]    Cervical high risk HPV (human papillomavirus) test positive Assessment & Plan: Continue follow up with [facility] at [facility]  last OV Sept 2025.    Menopause Assessment & Plan: HRT managed by Dr Verdon   Encounter for screening mammogram for malignant neoplasm of breast Assessment & Plan: Continue annual mammograms, last one July 2025.  She does her own breast exams monthly.    Other orders -     Ondansetron ; Take 1 tablet (4 mg total) by mouth every 8 (eight) hours as needed for nausea or vomiting.  Dispense: 20 tablet; Refill: 0    Return in about 6 months (around 12/15/2024).  Verneita LITTIE Kettering, MD

## 2024-06-17 NOTE — Assessment & Plan Note (Signed)
 Mild, with LDL < 130  . No indication for treatment .SABRA  Lab Results  Component Value Date   CHOL 213 (H) 06/16/2024   HDL 55.30 06/16/2024   LDLCALC 117 (H) 06/16/2024   LDLDIRECT 127.0 06/16/2024   TRIG 203.0 (H) 06/16/2024   CHOLHDL 4 06/16/2024

## 2024-07-19 ENCOUNTER — Other Ambulatory Visit: Payer: Self-pay

## 2024-08-12 ENCOUNTER — Encounter: Payer: Self-pay | Admitting: Pharmacist

## 2024-08-12 ENCOUNTER — Other Ambulatory Visit: Payer: Self-pay

## 2024-08-13 ENCOUNTER — Other Ambulatory Visit: Payer: Self-pay

## 2024-08-20 ENCOUNTER — Other Ambulatory Visit (HOSPITAL_COMMUNITY): Payer: Self-pay

## 2024-08-21 ENCOUNTER — Other Ambulatory Visit: Payer: Self-pay

## 2024-08-21 ENCOUNTER — Other Ambulatory Visit (HOSPITAL_COMMUNITY): Payer: Self-pay

## 2024-08-22 ENCOUNTER — Other Ambulatory Visit: Payer: Self-pay

## 2024-12-24 ENCOUNTER — Other Ambulatory Visit

## 2024-12-31 ENCOUNTER — Ambulatory Visit: Admitting: Internal Medicine
